# Patient Record
Sex: Male | Born: 1965 | Race: White | Hispanic: No | Marital: Married | Smoking: Never smoker
Health system: Southern US, Community
[De-identification: ages and names within clinical notes are randomized; demographics above are authoritative.]

## PROBLEM LIST (undated history)

## (undated) DIAGNOSIS — I10 Essential (primary) hypertension: Secondary | ICD-10-CM

## (undated) DIAGNOSIS — E785 Hyperlipidemia, unspecified: Secondary | ICD-10-CM

## (undated) DIAGNOSIS — F329 Major depressive disorder, single episode, unspecified: Secondary | ICD-10-CM

## (undated) DIAGNOSIS — F32A Depression, unspecified: Secondary | ICD-10-CM

## (undated) DIAGNOSIS — F419 Anxiety disorder, unspecified: Secondary | ICD-10-CM

## (undated) HISTORY — PX: HERNIA REPAIR: SHX51

## (undated) HISTORY — DX: Anxiety disorder, unspecified: F41.9

## (undated) HISTORY — DX: Hyperlipidemia, unspecified: E78.5

## (undated) HISTORY — DX: Depression, unspecified: F32.A

## (undated) HISTORY — DX: Major depressive disorder, single episode, unspecified: F32.9

## (undated) HISTORY — PX: ACHILLES TENDON REPAIR: SUR1153

## (undated) HISTORY — PX: APPENDECTOMY: SHX54

## (undated) HISTORY — DX: Essential (primary) hypertension: I10

---

## 2005-01-02 ENCOUNTER — Ambulatory Visit: Payer: Self-pay | Admitting: Family Medicine

## 2005-02-12 ENCOUNTER — Ambulatory Visit: Payer: Self-pay | Admitting: Family Medicine

## 2005-07-02 ENCOUNTER — Ambulatory Visit: Payer: Self-pay | Admitting: Family Medicine

## 2006-03-19 ENCOUNTER — Ambulatory Visit: Payer: Self-pay | Admitting: Family Medicine

## 2006-03-19 LAB — CONVERTED CEMR LAB
AST: 98 units/L — ABNORMAL HIGH (ref 0–37)
Albumin: 4.4 g/dL (ref 3.5–5.2)
BUN: 14 mg/dL (ref 6–23)
Calcium: 9.9 mg/dL (ref 8.4–10.5)
Chloride: 99 meq/L (ref 96–112)
Creatinine, Ser: 1.3 mg/dL (ref 0.4–1.5)
GFR calc non Af Amer: 65 mL/min
Glomerular Filtration Rate, Af Am: 78 mL/min/{1.73_m2}
HCT: 47.4 % (ref 39.0–52.0)
Hemoglobin: 15.9 g/dL (ref 13.0–17.0)
MCHC: 33.5 g/dL (ref 30.0–36.0)
MCV: 97.3 fL (ref 78.0–100.0)
Total Bilirubin: 1.4 mg/dL — ABNORMAL HIGH (ref 0.3–1.2)
WBC: 5.8 10*3/uL (ref 4.5–10.5)

## 2006-08-20 ENCOUNTER — Ambulatory Visit: Payer: Self-pay | Admitting: Family Medicine

## 2006-09-18 ENCOUNTER — Ambulatory Visit: Payer: Self-pay | Admitting: Family Medicine

## 2007-02-17 ENCOUNTER — Telehealth: Payer: Self-pay | Admitting: Family Medicine

## 2007-10-13 ENCOUNTER — Telehealth: Payer: Self-pay | Admitting: Family Medicine

## 2007-10-21 ENCOUNTER — Telehealth: Payer: Self-pay | Admitting: Family Medicine

## 2010-08-25 NOTE — Assessment & Plan Note (Signed)
[facility] OFFICE NOTE   NAME:[name], [name] BURAK                    MRN:          [id number]  DATE:03/19/2006                            DOB:          [birth year]    This is a 45 year old gentleman here for a complete physical  examination.  In general he has been doing fairly well, although he has  some  chronic low back pain.  He has been using over-the-counter agents  intermittently but would like to get on a more potent agent on a daily  basis.  He thinks his blood pressure has been fairly stable but he has  not checked it in a while.  His anxiety has been a bit increased over  the past few months.  He has doubled the Xanax from once a day to twice  a day and feels that this in combination with his Zoloft is now doing  well.  For other details of his past medical history, family history,  social history, habits, etc., I refer you to his introductory note here  on June 14, 2003.   ALLERGIES:  None.   CURRENT MEDICATIONS:  1. Lotrel 10/20 once a day.  2. Metoprolol XL 25 mg once a day.  3. Sertraline 100 mg once a day.  4. Xanax 1 mg b.i.d.  5. Flexeril 10 mg as needed.   OBJECTIVE:  VITAL SIGNS:  Height 6 feet 1 inch, weight 238, BP 128/72,  pulse 72 and regular.  GENERAL:  He remains overweight.  SKIN:  Clear.  HEENT:  Eyes are clear, ears are clear, pharynx clear.  NECK:  Supple without lymphadenopathy or masses.  LUNGS:  Clear.  CARDIAC:  Rate and rhythm regular without gallops,  murmurs, rubs.  Distal pulses are full.  ABDOMEN:  Soft, normal bowel sounds, nontender, no masses.  GENITALIA:  Normal male.  EXTREMITIES:  No clubbing, cyanosis, or edema.  NEUROLOGIC:  Grossly intact.  His affect is bright and he appears quite  comfortable.   ASSESSMENT AND PLAN:  1. Complete physical examination.  Recommended increased exercise and      will get some laboratory work today since he is fasting.  2.  Hypertension, stable, but to help with cost will switch from Lotrel      to Azor 10/20 to take once      daily.  I gave him samples today to last for the next 3 months.  3. Anxiety, stable.  I refilled his Xanax at his current level of 1 mg      b.i.d. #60 with five refills.  4. Degenerative joint pain.  Lodine 500 mg b.i.d.     Tera Mater. Clent Ridges, MD  Electronically Signed    SAF/MedQ  DD: 03/19/2006  DT: 03/20/2006  Job #: [id number]

## 2010-10-05 ENCOUNTER — Ambulatory Visit (INDEPENDENT_AMBULATORY_CARE_PROVIDER_SITE_OTHER): Payer: BC Managed Care – PPO | Admitting: Family Medicine

## 2010-10-05 ENCOUNTER — Encounter: Payer: Self-pay | Admitting: Family Medicine

## 2010-10-05 VITALS — BP 120/74 | Temp 98.6°F | Ht 73.5 in | Wt 238.0 lb

## 2010-10-05 DIAGNOSIS — K219 Gastro-esophageal reflux disease without esophagitis: Secondary | ICD-10-CM

## 2010-10-05 DIAGNOSIS — F411 Generalized anxiety disorder: Secondary | ICD-10-CM

## 2010-10-05 DIAGNOSIS — I10 Essential (primary) hypertension: Secondary | ICD-10-CM

## 2010-10-05 DIAGNOSIS — F419 Anxiety disorder, unspecified: Secondary | ICD-10-CM

## 2010-10-05 MED ORDER — METOPROLOL TARTRATE 25 MG PO TABS: 25.0000 mg | ORAL_TABLET | Freq: Two times a day (BID) | ORAL | Status: DC

## 2010-10-05 MED ORDER — LANSOPRAZOLE 30 MG PO CPDR: 30.0000 mg | DELAYED_RELEASE_CAPSULE | Freq: Every day | ORAL | Status: DC

## 2010-10-05 MED ORDER — AMLODIPINE-OLMESARTAN 10-20 MG PO TABS: 1.0000 | ORAL_TABLET | Freq: Every day | ORAL | Status: DC

## 2010-10-05 MED ORDER — ALPRAZOLAM 1 MG PO TABS: 1.0000 mg | ORAL_TABLET | Freq: Two times a day (BID) | ORAL | Status: DC

## 2010-10-05 NOTE — Progress Notes (Signed)
  Subjective:    Patient ID: [name] [name], male    DOB: 02-Feb-1966, 45 y.o.   MRN: [id number]  HPI 45 yr old male to re-establish with [place] after living in [place] for the past 4 years. He has moved back to [place] to help his mother, who has some health issues. He has not found a job yet. He feels well but asks for med refills. His BP has been stable. He eats well and works out regularly. His anxiety has been stable.    Review of Systems  Constitutional: Negative.   Respiratory: Negative.   Cardiovascular: Negative.   Psychiatric/Behavioral: Negative.        Objective:   Physical Exam  Constitutional: He appears well-developed and well-nourished.  Neck: No thyromegaly present.  Cardiovascular: Normal rate, regular rhythm, normal heart sounds and intact distal pulses.   Pulmonary/Chest: Effort normal and breath sounds normal.  Lymphadenopathy:    He has no cervical adenopathy.  Psychiatric: He has a normal mood and affect. His behavior is normal. Thought content normal.          Assessment & Plan:  Refilled meds as above. He will plan on getting a cpx soon

## 2011-04-17 ENCOUNTER — Encounter: Payer: Self-pay | Admitting: Family Medicine

## 2011-04-17 ENCOUNTER — Ambulatory Visit (INDEPENDENT_AMBULATORY_CARE_PROVIDER_SITE_OTHER): Payer: BC Managed Care – PPO | Admitting: Family Medicine

## 2011-04-17 VITALS — BP 116/70 | HR 82 | Temp 98.7°F | Wt 240.0 lb

## 2011-04-17 DIAGNOSIS — F411 Generalized anxiety disorder: Secondary | ICD-10-CM

## 2011-04-17 DIAGNOSIS — F419 Anxiety disorder, unspecified: Secondary | ICD-10-CM

## 2011-04-17 DIAGNOSIS — I10 Essential (primary) hypertension: Secondary | ICD-10-CM

## 2011-04-17 MED ORDER — ALPRAZOLAM 1 MG PO TABS: 1.0000 mg | ORAL_TABLET | Freq: Two times a day (BID) | ORAL | Status: DC

## 2011-04-17 NOTE — Progress Notes (Signed)
  Subjective:    Patient ID: [name] [name], male    DOB: Jun 24, 1965, 46 y.o.   MRN: [id number]  HPI Here to follow up. He feels good, and his BP is stable. He is not lifting weights now but he does try to walk every day. Still looking for work.    Review of Systems  Constitutional: Negative.   Respiratory: Negative.   Cardiovascular: Negative.   Psychiatric/Behavioral: Negative.        Objective:   Physical Exam  Constitutional: He appears well-developed and well-nourished.  Cardiovascular: Normal rate, regular rhythm, normal heart sounds and intact distal pulses.   Pulmonary/Chest: Effort normal and breath sounds normal.  Psychiatric: He has a normal mood and affect. His behavior is normal. Thought content normal.          Assessment & Plan:  Doing well. Refilled meds

## 2011-06-05 ENCOUNTER — Encounter: Payer: Self-pay | Admitting: Family Medicine

## 2011-06-05 ENCOUNTER — Ambulatory Visit (INDEPENDENT_AMBULATORY_CARE_PROVIDER_SITE_OTHER): Payer: Self-pay | Admitting: Family Medicine

## 2011-06-05 VITALS — BP 108/70 | HR 82 | Temp 98.9°F | Wt 238.0 lb

## 2011-06-05 DIAGNOSIS — J40 Bronchitis, not specified as acute or chronic: Secondary | ICD-10-CM

## 2011-06-05 MED ORDER — AZITHROMYCIN 250 MG PO TABS: ORAL_TABLET | ORAL | Status: AC

## 2011-06-05 NOTE — Progress Notes (Signed)
  Subjective:    Patient ID: Kamarri Lovvorn Beauchaine, male    DOB: 11-Sep-1965, 46 y.o.   MRN: [id number]  HPI Here for one week of chest tightness and coughing up green sputum. No fever.    Review of Systems  Constitutional: Negative.   HENT: Negative.   Eyes: Negative.   Respiratory: Positive for cough and chest tightness.   Cardiovascular: Negative.        Objective:   Physical Exam  Constitutional: He appears well-developed and well-nourished.  HENT:  Right Ear: External ear normal.  Left Ear: External ear normal.  Nose: Nose normal.  Mouth/Throat: Oropharynx is clear and moist. No oropharyngeal exudate.  Eyes: Conjunctivae are normal.  Pulmonary/Chest: Effort normal and breath sounds normal. No respiratory distress. He has no wheezes. He has no rales.  Lymphadenopathy:    He has no cervical adenopathy.          Assessment & Plan:  Add Mucinex

## 2011-06-27 ENCOUNTER — Emergency Department (HOSPITAL_COMMUNITY)
Admission: EM | Admit: 2011-06-27 | Discharge: 2011-06-27 | Disposition: A | Discharge status: Home Health | Payer: BC Managed Care – PPO | Attending: Emergency Medicine | Admitting: Emergency Medicine

## 2011-06-27 ENCOUNTER — Ambulatory Visit (INDEPENDENT_AMBULATORY_CARE_PROVIDER_SITE_OTHER): Payer: BC Managed Care – PPO | Admitting: Family Medicine

## 2011-06-27 ENCOUNTER — Emergency Department (HOSPITAL_COMMUNITY): Payer: BC Managed Care – PPO

## 2011-06-27 ENCOUNTER — Encounter (HOSPITAL_COMMUNITY): Payer: Self-pay | Admitting: Adult Health

## 2011-06-27 ENCOUNTER — Encounter: Payer: Self-pay | Admitting: Family Medicine

## 2011-06-27 ENCOUNTER — Ambulatory Visit: Payer: Self-pay | Admitting: Family Medicine

## 2011-06-27 VITALS — BP 118/74 | HR 97 | Temp 101.8°F | Wt 239.0 lb

## 2011-06-27 DIAGNOSIS — IMO0001 Reserved for inherently not codable concepts without codable children: Secondary | ICD-10-CM | POA: Insufficient documentation

## 2011-06-27 DIAGNOSIS — R059 Cough, unspecified: Secondary | ICD-10-CM | POA: Insufficient documentation

## 2011-06-27 DIAGNOSIS — R079 Chest pain, unspecified: Secondary | ICD-10-CM

## 2011-06-27 DIAGNOSIS — R209 Unspecified disturbances of skin sensation: Secondary | ICD-10-CM | POA: Insufficient documentation

## 2011-06-27 DIAGNOSIS — J189 Pneumonia, unspecified organism: Secondary | ICD-10-CM

## 2011-06-27 DIAGNOSIS — I10 Essential (primary) hypertension: Secondary | ICD-10-CM | POA: Insufficient documentation

## 2011-06-27 DIAGNOSIS — J069 Acute upper respiratory infection, unspecified: Secondary | ICD-10-CM | POA: Insufficient documentation

## 2011-06-27 DIAGNOSIS — R05 Cough: Secondary | ICD-10-CM | POA: Insufficient documentation

## 2011-06-27 DIAGNOSIS — R509 Fever, unspecified: Secondary | ICD-10-CM

## 2011-06-27 DIAGNOSIS — E785 Hyperlipidemia, unspecified: Secondary | ICD-10-CM | POA: Insufficient documentation

## 2011-06-27 DIAGNOSIS — R0602 Shortness of breath: Secondary | ICD-10-CM | POA: Insufficient documentation

## 2011-06-27 DIAGNOSIS — Z79899 Other long term (current) drug therapy: Secondary | ICD-10-CM | POA: Insufficient documentation

## 2011-06-27 DIAGNOSIS — F341 Dysthymic disorder: Secondary | ICD-10-CM | POA: Insufficient documentation

## 2011-06-27 LAB — BASIC METABOLIC PANEL
BUN: 11 mg/dL (ref 6–23)
CO2: 28 mEq/L (ref 19–32)
Calcium: 9.6 mg/dL (ref 8.4–10.5)
Chloride: 95 mEq/L — ABNORMAL LOW (ref 96–112)
Creatinine, Ser: 1.28 mg/dL (ref 0.50–1.35)
GFR calc Af Amer: 76 mL/min — ABNORMAL LOW (ref >90)
GFR calc non Af Amer: 66 mL/min — ABNORMAL LOW (ref >90)
Glucose, Bld: 99 mg/dL (ref 70–99)
Potassium: 4 mEq/L (ref 3.5–5.1)
Sodium: 135 mEq/L (ref 135–145)

## 2011-06-27 LAB — POCT I-STAT TROPONIN I: Troponin i, poc: 0.03 ng/mL (ref 0.00–0.08)

## 2011-06-27 LAB — CBC
HCT: 40.8 % (ref 39.0–52.0)
Hemoglobin: 14.5 g/dL (ref 13.0–17.0)
MCH: 31.9 pg (ref 26.0–34.0)
MCHC: 35.5 g/dL (ref 30.0–36.0)
MCV: 89.9 fL (ref 78.0–100.0)
Platelets: 144 10*3/uL — ABNORMAL LOW (ref 150–400)
RBC: 4.54 MIL/uL (ref 4.22–5.81)
RDW: 12.2 % (ref 11.5–15.5)
WBC: 4 10*3/uL (ref 4.0–10.5)

## 2011-06-27 MED ORDER — GUAIFENESIN-CODEINE 100-10 MG/5ML PO SOLN
5.0000 mL | Freq: Once | ORAL | Status: AC
  Administered 2011-06-27: 5 mL via ORAL
  Filled 2011-06-27: qty 5

## 2011-06-27 MED ORDER — SODIUM CHLORIDE 0.9 % IV BOLUS (SEPSIS)
1000.0000 mL | Freq: Once | INTRAVENOUS | Status: AC
  Administered 2011-06-27: 1000 mL via INTRAVENOUS

## 2011-06-27 MED ORDER — GUAIFENESIN-CODEINE 100-10 MG/5ML PO SYRP: 5.0000 mL | ORAL_SOLUTION | Freq: Three times a day (TID) | ORAL | Status: AC | PRN

## 2011-06-27 MED ORDER — KETOROLAC TROMETHAMINE 30 MG/ML IJ SOLN
INTRAMUSCULAR | Status: AC
  Administered 2011-06-27: 15 mg
  Filled 2011-06-27: qty 1

## 2011-06-27 MED ORDER — KETOROLAC TROMETHAMINE 15 MG/ML IJ SOLN
15.0000 mg | Freq: Once | INTRAMUSCULAR | Status: DC
  Filled 2011-06-27: qty 1

## 2011-06-27 MED ORDER — IOHEXOL 300 MG/ML  SOLN
150.0000 mL | Freq: Once | INTRAMUSCULAR | Status: AC | PRN
  Administered 2011-06-27: 150 mL via INTRAVENOUS

## 2011-06-27 NOTE — ED Provider Notes (Signed)
History    46yM with fever and cough. Onset yesterday. Progressively worsening. Cough nonproductive. Dyspnea. No unusual leg pain or swelling. No sick contacts. No nausea or vomiting. Denies history of blood clot. Denies chest pain. No change in mental status. CSN: [id number]  Arrival date & time 06/27/11  1626   First MD Initiated Contact with Patient 06/27/11 1834      Chief Complaint  Patient presents with  . Fever  . Shortness of Breath  . Numbness    (Consider location/radiation/quality/duration/timing/severity/associated sxs/prior treatment) HPI  Past Medical History  Diagnosis Date  . Hypertension   . Hyperlipidemia   . Depression   . Anxiety     Past Surgical History  Procedure Date  . Appendectomy   . Hernia repair   . Achilles tendon repair     Family History  Problem Relation Age of Onset  . Adopted: Yes    History  Substance Use Topics  . Smoking status: Never Smoker   . Smokeless tobacco: Never Used  . Alcohol Use: 0.5 oz/week    1 drink(s) per week      Review of Systems   Review of symptoms negative unless otherwise noted in HPI.  Allergies  Review of patient's allergies indicates no known allergies.  Home Medications   Current Outpatient Rx  Name Route Sig Dispense Refill  . ALPRAZOLAM 1 MG PO TABS Oral Take 1 mg by mouth. Take once a day, sometimes twice    . AMLODIPINE-OLMESARTAN 10-20 MG PO TABS Oral Take 1 tablet by mouth daily. 30 tablet 11  . LANSOPRAZOLE 30 MG PO CPDR Oral Take 1 capsule (30 mg total) by mouth daily. 30 capsule 11  . METOPROLOL TARTRATE 25 MG PO TABS Oral Take 1 tablet (25 mg total) by mouth 2 (two) times daily. 60 tablet 11    BP 142/79  Pulse 100  Temp(Src) 101.4 F (38.6 C) (Oral)  Resp 20  SpO2 98%  Physical Exam  Nursing note and vitals reviewed. Constitutional: He appears well-developed and well-nourished. No distress.       Sitting up in bed. Mildly uncomfortable but not toxic.  HENT:  Head:  Normocephalic and atraumatic.  Eyes: Conjunctivae are normal. Pupils are equal, round, and reactive to light. Right eye exhibits no discharge. Left eye exhibits no discharge.  Neck: Neck supple.  Cardiovascular: Regular rhythm and normal heart sounds.  Exam reveals no gallop and no friction rub.   No murmur heard.      Mildly tachycardic with a regular rhythm  Pulmonary/Chest: Effort normal and breath sounds normal. No respiratory distress. He exhibits no tenderness.  Abdominal: Soft. He exhibits no distension. There is no tenderness.  Musculoskeletal: He exhibits no edema and no tenderness.       Lower extremities symmetric as compared to each other. There is no calf tenderness. No edema. Negative Homans sign. No palpable cords.  Neurological: He is alert.  Skin: Skin is warm and dry.  Psychiatric: He has a normal mood and affect. His behavior is normal. Thought content normal.    ED Course  Procedures (including critical care time)  Labs Reviewed  CBC - Abnormal; Notable for the following:    Platelets 144 (*)    All other components within normal limits  BASIC METABOLIC PANEL - Abnormal; Notable for the following:    Chloride 95 (*)    GFR calc non Af Amer 66 (*)    GFR calc Af Amer 76 (*)  All other components within normal limits  POCT I-STAT TROPONIN I   Dg Chest 2 View  06/27/2011  *RADIOLOGY REPORT*  Clinical Data: Short of breath and fever.  CHEST - 2 VIEW  Comparison: None.  Findings: Heart size is normal.  Mediastinal shadows are normal. Lungs are clear.  No effusions.  No bony abnormalities.  IMPRESSION: Normal chest  Original Report Authenticated By: Thomasenia Sales, M.D.   Ct Angio Chest W/cm &/or Wo Cm  06/27/2011  *RADIOLOGY REPORT*  Clinical Data: Fever and shortness of breath.  CT ANGIOGRAPHY CHEST  Technique:  Multidetector CT imaging of the chest using the standard protocol during bolus administration of intravenous contrast. Multiplanar reconstructed images  including MIPs were obtained and reviewed to evaluate the vascular anatomy.  Contrast: OMNIPAQUE IOHEXOL 300 MG/ML IJ SOLN  Comparison: None  Findings:  No enlarged axillary or supraclavicular lymph nodes.  Borderline right paratracheal lymph node measures 1.1 cm.  The subcarinal lymph node measures 1 cm, image 117.  No pericardial or pleural effusion identified.  Trachea appears patent and is midline.  The lungs are clear.  There is no airspace consolidation.  No suspicious pulmonary nodule or mass.  No abnormal filling defects identified within the main pulmonary artery or its branches to suggest an acute pulmonary embolus.  Review of the visualized bony structures is unremarkable.  IMPRESSION:  1.  No active cardiopulmonary abnormalities. 2.  No evidence for pulmonary embolus.  Original Report Authenticated By: Rosealee Albee, M.D.   EKG:  Rhythm: Normal sinus rhythm Rate: 96 Axis: Normal axis Intervals: Normal ST segments: Nonspecific ST changes. There is T-wave flattening laterally and inferiorly  1. Fever   2. Upper respiratory infection       MDM  46 year old male with a febrile illness. Suspect viral etiology. Consider bacterial cause but clinically less concern for this. Patient is febrile but is generally well appearing. He is not showing any signs of respiratory distress. His oxygen saturations are normal on room air. His lung examination is nonfocal. Chest x-ray and subsequent CT angiography of his chest did not show any evidence of focal infiltrate, pulmonary embolism or other pathology which could possibly explain his symptoms. He has no leukocytosis. I feel the patient is safe for discharge at this time. Plan symptomatic treatment. Return precautions were discussed. Outpatient followup.         Raeford Razor, MD 07/13/11 [phone]

## 2011-06-27 NOTE — ED Notes (Signed)
Went to Dr. This am with with a dry panful cough, SOB and numbness, fever. Sent here for further evaluation. Bilateral lung sounds diminished. Pt unable to take deep breath.

## 2011-06-27 NOTE — Progress Notes (Signed)
  Subjective:    Patient ID: [name] [name], male    DOB: 08/15/1965, 46 y.o.   MRN: [id number]  HPI Here for the sudden onset yesterday of body aches, fevers, sweats, a severe HA, chest pains, and a deep non-productive cough. He is nauseated but has not vomited. He was seen here on 06-05-11 for a mild bronchitis and was given a Zpack. He says this worked well and he felt fine until yesterday. Today he is very weak and his sister and mother drove him here.    Review of Systems  Constitutional: Positive for fever and fatigue.  HENT: Negative.   Eyes: Negative.   Respiratory: Positive for cough, chest tightness, shortness of breath and wheezing.   Cardiovascular: Positive for chest pain. Negative for palpitations and leg swelling.       Objective:   Physical Exam  Constitutional:       Weak, shaky, looks quite ill   HENT:  Right Ear: External ear normal.  Left Ear: External ear normal.  Nose: Nose normal.  Mouth/Throat: Oropharynx is clear and moist. No oropharyngeal exudate.  Eyes: Conjunctivae are normal. Pupils are equal, round, and reactive to light.  Neck: Normal range of motion. Neck supple. No thyromegaly present.  Cardiovascular: Normal rate, regular rhythm, normal heart sounds and intact distal pulses.  Exam reveals no gallop and no friction rub.   No murmur heard.      EKG normal   Pulmonary/Chest: Effort normal and breath sounds normal. No respiratory distress. He has no wheezes. He has no rales. He exhibits no tenderness.  Abdominal: Soft. Bowel sounds are normal. He exhibits no distension and no mass. There is no tenderness. There is no rebound and no guarding.  Lymphadenopathy:    He has no cervical adenopathy.          Assessment & Plan:  Clinically this looks like pneumonia. He needs further evaluation with labs and a CXR. He needs IV fluids. His family will drive him directly from here to [facility] ER.

## 2011-06-27 NOTE — Discharge Instructions (Signed)
Antibiotic Nonuse  Your caregiver felt that the infection or problem was not one that would be helped with an antibiotic. Infections may be caused by viruses or bacteria. Only a caregiver can tell which one of these is the likely cause of an illness. A cold is the most common cause of infection in both adults and children. A cold is a virus. Antibiotic treatment will have no effect on a viral infection. Viruses can lead to many lost days of work caring for sick children and many missed days of school. Children may catch as many as 10 "colds" or "flus" per year during which they can be tearful, cranky, and uncomfortable. The goal of treating a virus is aimed at keeping the ill person comfortable. Antibiotics are medications used to help the body fight bacterial infections. There are relatively few types of bacteria that cause infections but there are hundreds of viruses. While both viruses and bacteria cause infection they are very different types of germs. A viral infection will typically go away by itself within 7 to 10 days. Bacterial infections may spread or get worse without antibiotic treatment. Examples of bacterial infections are:  Sore throats (like strep throat or tonsillitis).   Infection in the lung (pneumonia).   Ear and skin infections.  Examples of viral infections are:  Colds or flus.   Most coughs and bronchitis.   Sore throats not caused by Strep.   Runny noses.  It is often best not to take an antibiotic when a viral infection is the cause of the problem. Antibiotics can kill off the helpful bacteria that we have inside our body and allow harmful bacteria to start growing. Antibiotics can cause side effects such as allergies, nausea, and diarrhea without helping to improve the symptoms of the viral infection. Additionally, repeated uses of antibiotics can cause bacteria inside of our body to become resistant. That resistance can be passed onto harmful bacterial. The next time  you have an infection it may be harder to treat if antibiotics are used when they are not needed. Not treating with antibiotics allows our own immune system to develop and take care of infections more efficiently. Also, antibiotics will work better for [place] when they are prescribed for bacterial infections. Treatments for a child that is ill may include:  Give extra fluids throughout the day to stay hydrated.   Get plenty of rest.   Only give your child over-the-counter or prescription medicines for pain, discomfort, or fever as directed by your caregiver.   The use of a cool mist humidifier may help stuffy noses.   Cold medications if suggested by your caregiver.  Your caregiver may decide to start you on an antibiotic if:  The problem you were seen for today continues for a longer length of time than expected.   You develop a secondary bacterial infection.  SEEK MEDICAL CARE IF:  Fever lasts longer than 5 days.   Symptoms continue to get worse after 5 to 7 days or become severe.   Difficulty in breathing develops.   Signs of dehydration develop (poor drinking, rare urinating, dark colored urine).   Changes in behavior or worsening tiredness (listlessness or lethargy).  Document Released: 06/04/2001 Document Revised: 03/15/2011 Document Reviewed: 12/01/2008 [facility] Patient Information 2012 [place], [place].Fever, Adult A fever is a temperature of 100.4 F (38 C) or above.  HOME CARE  Take fever medicine as told by your doctor. Do not  take aspirin for fever if you are younger than  47 years of age.   If you are given antibiotic medicine, take it as told. Finish the medicine even if you start to feel better.   Rest.   Drink enough fluids to keep your pee (urine) clear or pale yellow. Do not drink alcohol.   Take a bath or shower with room temperature water. Do not use ice water or alcohol sponge baths.   Wear lightweight, loose clothes.  GET HELP RIGHT AWAY IF:   You are  short of breath or have trouble breathing.   You are very weak.   You are dizzy or you pass out (faint).   You are very thirsty or are making little or no urine.   You have new pain.   You throw up (vomit) or have watery poop (diarrhea).   You keep throwing up or having watery poop for more than 1 to 2 days.   You have a stiff neck or light bothers your eyes.   You have a skin rash.   You have a fever or problems (symptoms) that last for more than 2 to 3 days.   You have a fever and your problems quickly get worse.   You keep throwing up the fluids you drink.   You do not feel better after 3 days.   You have new problems.  MAKE SURE YOU:   Understand these instructions.   Will watch your condition.   Will get help right away if you are not doing well or get worse.  Document Released: 01/03/2008 Document Revised: 03/15/2011 Document Reviewed: 01/25/2011 [facility] Patient Information 2012 [place], [place].  RESOURCE GUIDE  Dental Problems  Patients with Medicaid: [facility] [phone] [address]                                           [phone] [address] Phone:  [phone]                                                  Phone:  [phone]  If unable to pay or uninsured, contact:  [facility] or [facility]. to become qualified for the adult dental clinic.  Chronic Pain Problems Contact [facility]  [phone] Patients need to be referred by their primary care doctor.  Insufficient Money for Medicine Contact [employer]:  call "211" or [facility] 680 217 1029.  No Primary Care Doctor Call [facility]  213-830-1475 Other agencies that provide inexpensive medical care    [facility]  [phone]    [facility]  [phone]    [facility]  [phone]    [facility]  [phone]    [facility]  [phone]    [facility]   [phone]  Psychological Services [facility]  [phone] [facility] Services  [phone] [facility]   [phone] (emergency services 279-679-8305)  Substance Abuse Resources [facility]  813-178-3701 [facility] [phone]) [phone] [place] 201-589-8440 Floydene Flock ([phone] Residential & Outpatient Substance Abuse Program  260-695-8370  Abuse/Neglect [facility]  Child Abuse Hotline (573)426-5175 [facility] Child Abuse Hotline 5732656398 (After Hours)  Emergency Shelter [facility] [phone]  M[facility] [place] [facility] (214)062-1792 [facility] 864-164-7710  MRSA Hotline #:   432-199-6511    [facility] Resources  [facility]     [employer]                          [facility] Dept. [address] [place]                       [address]      [address]  Blondell Reveal Phone:  [phone]                                   Phone:  [phone]                 Phone:  [phone]  [facility] Mental [facility] Phone:  [phone]  [facility] Child Abuse Hotline [phone] (After Hours)

## 2011-06-29 ENCOUNTER — Telehealth: Payer: Self-pay | Admitting: Family Medicine

## 2011-06-29 NOTE — Telephone Encounter (Signed)
Dr. Clent Ridges did approve this and the note is ready for pick up. I spoke with pt and his mom will be picking up the note.

## 2011-06-29 NOTE — Telephone Encounter (Signed)
Pt called back and was informed he would need to come in to see the doctor. Pt stated that his job is temporary and that he just needed a note for the week of March 17th- 23rd stating that he was in the hospital and was unable to work. Pt is worried if he does not receive the note they will say it was job abandonment. Pt did not want to schedule appt at this time.please advise

## 2011-06-29 NOTE — Telephone Encounter (Signed)
Patient called stating he need a note out of work or he will be terminated patient was sent to hospital on 06/27/11 and can not return to work until the 07/07/11 per the hospital. Please assist.

## 2011-06-29 NOTE — Telephone Encounter (Signed)
I would need to see him again to determine how long he needs to be out of work.

## 2011-07-11 ENCOUNTER — Telehealth: Payer: Self-pay | Admitting: Family Medicine

## 2011-07-11 NOTE — Telephone Encounter (Signed)
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 I advised patient to come by and request his lov which lists his medications. FYI

## 2011-07-11 NOTE — Telephone Encounter (Signed)
I spoke with pt and this was already done.

## 2011-10-19 ENCOUNTER — Other Ambulatory Visit: Payer: Self-pay | Admitting: Family Medicine

## 2011-10-19 ENCOUNTER — Telehealth: Payer: Self-pay | Admitting: Family Medicine

## 2011-10-19 MED ORDER — ALPRAZOLAM 1 MG PO TABS: 1.0000 mg | ORAL_TABLET | Freq: Two times a day (BID) | ORAL | Status: DC | PRN

## 2011-10-19 NOTE — Telephone Encounter (Signed)
Call in #60 with 5 rf 

## 2011-10-19 NOTE — Telephone Encounter (Signed)
I called in script 

## 2011-10-19 NOTE — Telephone Encounter (Signed)
Refill request for Alprazolam 1 mg take 1 po bid and pt last here on 06/27/11.

## 2011-10-22 ENCOUNTER — Other Ambulatory Visit: Payer: Self-pay | Admitting: Family Medicine

## 2012-04-19 ENCOUNTER — Other Ambulatory Visit: Payer: Self-pay | Admitting: Family Medicine

## 2012-04-21 NOTE — Telephone Encounter (Signed)
Call in #60 with 5 rf 

## 2012-04-23 ENCOUNTER — Telehealth: Payer: Self-pay | Admitting: Family Medicine

## 2012-04-23 NOTE — Telephone Encounter (Signed)
Prior auth in process. Pt aware

## 2012-04-23 NOTE — Telephone Encounter (Signed)
Pt's insurance company is requiring prior authorization, according to pt's pharm for his  AZOR 10-20 MG per tablet.   Pt will take his last tablet tonight. Pls advise.

## 2012-04-24 ENCOUNTER — Telehealth: Payer: Self-pay | Admitting: Family Medicine

## 2012-04-24 NOTE — Telephone Encounter (Signed)
PLEASE CALL Danielle at [phone].[name] [name] states he takes Animator for hypertension. States he notified office on 04/23/12 that he was taking his last dose on 04/23/12. Pharmacy was waiting for authorization. [name] [name] states he found out on 04/24/12 that his insurance will no longer cover his Azor- states he signed up for the Affordable Care act.  [place] requesting a call back from Dr. Clent Ridges. Requesting a new prescription be called in today 04/24/12 for high blood pressure. 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 Wants a medication that does not cause erectile dysfunction. [facility]ses [facility] [address] [phone]

## 2012-04-25 MED ORDER — AMLODIPINE BESYLATE 10 MG PO TABS: 10.0000 mg | ORAL_TABLET | Freq: Every day | ORAL | Status: DC

## 2012-04-25 MED ORDER — OLMESARTAN MEDOXOMIL 20 MG PO TABS: 20.0000 mg | ORAL_TABLET | Freq: Every day | ORAL | Status: DC

## 2012-04-25 NOTE — Telephone Encounter (Signed)
I spoke with pt and sent both scripts e-scribe. 

## 2012-04-25 NOTE — Telephone Encounter (Signed)
Please see note below. There is a prior auth (as requested) on the AZOR in process. Hoping to hear today if it is approved. Sounds like pt wants a diff rx instead. Please advise. Is now out of BP meds.

## 2012-04-25 NOTE — Telephone Encounter (Signed)
Lets split this up into Olmesartan 20 mg a day and Amlodipine 10 mg a day. Call in #30 of both with 11 rf

## 2012-04-25 NOTE — Telephone Encounter (Signed)
Dr. Clent Ridges,  Patient is agreeable to calling in both components of the AZOR separately, as generics, for 30 days. He would like to pick up today, as he is out of meds. Currently on amlodipine-olmesartan (AZOR) 10-20 MG per tablet  [facility] [address]

## 2012-04-28 ENCOUNTER — Telehealth: Payer: Self-pay | Admitting: Family Medicine

## 2012-04-28 NOTE — Telephone Encounter (Signed)
Actually just cancel the order for Olmesartan and substitute Cozaar 50 mg a day to take along with the Amlodipine.

## 2012-04-28 NOTE — Telephone Encounter (Signed)
Dr. Clent Ridges,  Patient's AZOR was denied. Additionally, when we tried to send in the olmesartan and amlodipine separately, [employer] wants prior auth on the Benicar.  However, in the denial for the AZOR, these were listed as preferred: Atacand, Atacand HCT, Avalide, Avapro, Cozaar, Diovan HCT (NOT regular Diovan), Hyzaar, teveten, teveten HCT. Will one of these work, or should I proceed w/prior auth on olmesartan?

## 2012-04-29 ENCOUNTER — Ambulatory Visit (INDEPENDENT_AMBULATORY_CARE_PROVIDER_SITE_OTHER): Payer: BC Managed Care – PPO | Admitting: Family Medicine

## 2012-04-29 ENCOUNTER — Encounter: Payer: Self-pay | Admitting: Family Medicine

## 2012-04-29 VITALS — BP 112/74 | HR 76 | Temp 98.6°F | Wt 233.0 lb

## 2012-04-29 DIAGNOSIS — I10 Essential (primary) hypertension: Secondary | ICD-10-CM

## 2012-04-29 MED ORDER — LOSARTAN POTASSIUM 50 MG PO TABS: 50.0000 mg | ORAL_TABLET | Freq: Every day | ORAL | Status: DC

## 2012-04-29 NOTE — Telephone Encounter (Signed)
Nettie Elm, please see note below and send in rx. Thanks!

## 2012-04-29 NOTE — Progress Notes (Signed)
  Subjective:    Patient ID: [name] [name], male    DOB: February 22, 1966, 47 y.o.   MRN: [id number]  HPI Here to discuss his HTN. He tells me that he stopped taking Human Growth Hormone and testosterone a year ago and that he has lost 45 lbs. His BP at home is usually 110-120 systolic and he feels great. His insurance company recently said they would no longer cover Azor.    Review of Systems  Constitutional: Negative.   Respiratory: Negative.   Cardiovascular: Negative.        Objective:   Physical Exam  Constitutional: He appears well-developed and well-nourished.  Cardiovascular: Normal rate, regular rhythm, normal heart sounds and intact distal pulses.   Pulmonary/Chest: Effort normal and breath sounds normal.          Assessment & Plan:  Since his HTN is so well controlled we can cut back on his meds a little. We will put him on Metoprolol and Amlodipine and stop the ARB. Recheck one month

## 2012-04-29 NOTE — Telephone Encounter (Signed)
I sent Cozaar script e-scribe

## 2012-05-26 ENCOUNTER — Encounter: Payer: Self-pay | Admitting: Family

## 2012-05-26 ENCOUNTER — Ambulatory Visit (INDEPENDENT_AMBULATORY_CARE_PROVIDER_SITE_OTHER): Payer: BC Managed Care – PPO | Admitting: Family

## 2012-05-26 VITALS — BP 144/90 | HR 67 | Temp 98.4°F | Wt 230.0 lb

## 2012-05-26 DIAGNOSIS — I10 Essential (primary) hypertension: Secondary | ICD-10-CM

## 2012-05-26 DIAGNOSIS — J029 Acute pharyngitis, unspecified: Secondary | ICD-10-CM

## 2012-05-26 MED ORDER — METHYLPREDNISOLONE ACETATE 40 MG/ML IJ SUSP
80.0000 mg | Freq: Once | INTRAMUSCULAR | Status: AC
  Administered 2012-05-26: 80 mg via INTRAMUSCULAR

## 2012-05-26 MED ORDER — AMOXICILLIN 500 MG PO CAPS: 1000.0000 mg | ORAL_CAPSULE | Freq: Two times a day (BID) | ORAL | Status: DC

## 2012-05-26 NOTE — Patient Instructions (Addendum)

## 2012-05-26 NOTE — Progress Notes (Signed)
  Subjective:    Patient ID: [name] [name], male    DOB: [birth year], 47 y.o.   MRN: [id number]  HPI 47 year old, WM, nonsmoker, patient of Dr. Clent Ridges is in today with c/o worsening, sore throat x 3 days. Unable to eat. Denies fever, headache, or congestion.    Review of Systems  Constitutional: Negative.   HENT: Positive for sore throat. Negative for ear pain, congestion and postnasal drip.   Respiratory: Negative.   Cardiovascular: Negative.   Musculoskeletal: Negative.   Skin: Negative.   Allergic/Immunologic: Negative.   Neurological: Negative.   Hematological: Negative.   Psychiatric/Behavioral: Negative.    Past Medical History  Diagnosis Date  . Hypertension   . Hyperlipidemia   . Depression   . Anxiety     History   Social History  . Marital Status: Married    Spouse Name: N/A    Number of Children: N/A  . Years of Education: N/A   Occupational History  . Not on file.   Social History Main Topics  . Smoking status: Never Smoker   . Smokeless tobacco: Never Used  . Alcohol Use: 0.5 oz/week    1 drink(s) per week  . Drug Use: No  . Sexually Active: Not on file   Other Topics Concern  . Not on file   Social History Narrative  . No narrative on file    Past Surgical History  Procedure Laterality Date  . Appendectomy    . Hernia repair    . Achilles tendon repair      Family History  Problem Relation Age of Onset  . Adopted: Yes    No Known Allergies  Current Outpatient Prescriptions on File Prior to Visit  Medication Sig Dispense Refill  . ALPRAZolam (XANAX) 1 MG tablet TAKE 1 TABLET BY MOUTH TWICE A DAY AS NEEDED  60 tablet  5  . amLODipine (NORVASC) 10 MG tablet Take 1 tablet (10 mg total) by mouth daily.  30 tablet  11  . lansoprazole (PREVACID) 30 MG capsule TAKE ONE CAPSULE BY MOUTH EVERY DAY  30 capsule  11  . metoprolol tartrate (LOPRESSOR) 25 MG tablet TAKE 1 TABLET BY MOUTH TWICE A DAY  60 tablet  11   No current  facility-administered medications on file prior to visit.    BP 144/90  Pulse 67  Temp(Src) 98.4 F (36.9 C) (Oral)  Wt 230 lb (104.327 kg)  BMI 29.93 kg/m2  SpO2 97%chart    Objective:   Physical Exam  Constitutional: He is oriented to person, place, and time. He appears well-developed and well-nourished.  HENT:  Right Ear: External ear normal.  Left Ear: External ear normal.  Nose: Nose normal.  Throat is red, swollen, and tender.   Neck: Normal range of motion. Neck supple.  Cardiovascular: Normal rate, regular rhythm and normal heart sounds.   Pulmonary/Chest: Effort normal and breath sounds normal.  Neurological: He is oriented to person, place, and time.  Skin: Skin is warm and dry.  Psychiatric: He has a normal mood and affect.   Rapid Strep: Positive   Depo-Medrol 80mg  IM x 1      Assessment & Plan:  Assessment:  1. Pharyngitis-Strep 2. Hypertension  Plan: Amoxil 1000mg  twice a day. Continue current meds. Drink plenty of fluids. Call the office if symptoms worsen or persist. Recheck as scheduled and as needed.

## 2012-09-17 ENCOUNTER — Telehealth: Payer: Self-pay | Admitting: Family Medicine

## 2012-09-17 NOTE — Telephone Encounter (Signed)
Refill request for Amlodipine 10 mg, Metoprolol 25 mg, Losartan 50 mg, Lansoprazole 30 and a 90 day supply.

## 2012-09-18 MED ORDER — AMLODIPINE BESYLATE 10 MG PO TABS: 10.0000 mg | ORAL_TABLET | Freq: Every day | ORAL | Status: DC

## 2012-09-18 MED ORDER — METOPROLOL TARTRATE 25 MG PO TABS: 25.0000 mg | ORAL_TABLET | Freq: Two times a day (BID) | ORAL | Status: DC

## 2012-09-18 MED ORDER — LANSOPRAZOLE 30 MG PO CPDR: 30.0000 mg | DELAYED_RELEASE_CAPSULE | Freq: Every day | ORAL | Status: DC

## 2012-09-18 MED ORDER — LOSARTAN POTASSIUM 50 MG PO TABS: 50.0000 mg | ORAL_TABLET | Freq: Every day | ORAL | Status: DC

## 2012-09-18 NOTE — Telephone Encounter (Signed)
I sent all scripts e-scribe.

## 2012-10-06 ENCOUNTER — Telehealth: Payer: Self-pay | Admitting: Family Medicine

## 2012-10-06 DIAGNOSIS — M25529 Pain in unspecified elbow: Secondary | ICD-10-CM

## 2012-10-06 NOTE — Telephone Encounter (Signed)
Referral was done  

## 2012-10-06 NOTE — Telephone Encounter (Signed)
I spoke with pt  

## 2012-10-06 NOTE — Telephone Encounter (Signed)
Patient Information:  Caller Name: [name]  Phone: [phone]  Patient: [name] [name], [name] [name]  Gender: Male  DOB: [birth year]  Age: 47 Years  PCP: Gershon Crane [facility])  Office Follow Up:  Does the office need to follow up with this patient?: Yes  Instructions For The Office: Please call ASAP regarding MD recommendation for optiomal place to be seen for muscle injury.  RN Note:  RN advised to go to ED or UC now but he is reluctant since they may not do needed evaluation/testing.  Requesting referral to orthopod or ortho urgent care since thinks will need a MRI to evaluate. Information noted and sent to office staff for MD recommendation and immediate call back.  Symptoms  Reason For Call & Symptoms: Ruptured tendons in Left arm and "bicep rolled up" while working around home approximately 1500.  Can move fingers and wrist.  Unable to move elbow due to pain. No active bleeding.  Reviewed Health History In EMR: Yes  Reviewed Medications In EMR: Yes  Reviewed Allergies In EMR: Yes  Reviewed Surgeries / Procedures: Yes  Date of Onset of Symptoms: 10/06/2012  Treatments Tried: Ice Pack, shot of alcohol  Treatments Tried Worked: No  Guideline(s) Used:  Arm Injury  Disposition Per Guideline:   Go to ED Now (or to Office with PCP Approval)  Reason For Disposition Reached:   Can't move injured arm at all  Advice Given:  N/A  RN Overrode Recommendation:  Follow Up With Office Later  Please call back immediately regarding  MD recommendation for orthopedic urgent care.

## 2012-10-08 ENCOUNTER — Other Ambulatory Visit (HOSPITAL_COMMUNITY): Payer: Self-pay | Admitting: Orthopedic Surgery

## 2012-10-08 DIAGNOSIS — M25522 Pain in left elbow: Secondary | ICD-10-CM

## 2012-10-13 ENCOUNTER — Ambulatory Visit (HOSPITAL_COMMUNITY)
Admission: RE | Admit: 2012-10-13 | Discharge: 2012-10-13 | Disposition: A | Discharge status: Home / Self Care | Payer: BC Managed Care – PPO | Source: Ambulatory Visit | Attending: Orthopedic Surgery | Admitting: Orthopedic Surgery

## 2012-10-13 DIAGNOSIS — M25522 Pain in left elbow: Secondary | ICD-10-CM

## 2012-10-13 DIAGNOSIS — S53499A Other sprain of unspecified elbow, initial encounter: Secondary | ICD-10-CM | POA: Insufficient documentation

## 2012-10-13 DIAGNOSIS — X500XXA Overexertion from strenuous movement or load, initial encounter: Secondary | ICD-10-CM | POA: Insufficient documentation

## 2012-11-18 ENCOUNTER — Other Ambulatory Visit: Payer: Self-pay | Admitting: Family Medicine

## 2012-11-18 ENCOUNTER — Telehealth: Payer: Self-pay | Admitting: Family Medicine

## 2012-11-18 MED ORDER — ALPRAZOLAM 1 MG PO TABS: 1.0000 mg | ORAL_TABLET | Freq: Two times a day (BID) | ORAL | Status: DC | PRN

## 2012-11-18 NOTE — Telephone Encounter (Signed)
Refill once 

## 2012-11-18 NOTE — Telephone Encounter (Signed)
Pt needs refill on Xanax 1 mg take 1 po bid prn, can we write for 30 day supply? Pt last seen on 05/26/12.

## 2012-11-18 NOTE — Telephone Encounter (Signed)
I called in script 

## 2012-11-29 ENCOUNTER — Other Ambulatory Visit: Payer: Self-pay | Admitting: Family Medicine

## 2012-12-01 NOTE — Telephone Encounter (Signed)
Call in #60 with 5 rf 

## 2012-12-10 ENCOUNTER — Ambulatory Visit (INDEPENDENT_AMBULATORY_CARE_PROVIDER_SITE_OTHER): Payer: BC Managed Care – PPO | Admitting: Family Medicine

## 2012-12-10 ENCOUNTER — Encounter: Payer: Self-pay | Admitting: Family Medicine

## 2012-12-10 VITALS — BP 124/78 | HR 77 | Temp 98.7°F | Wt 226.0 lb

## 2012-12-10 DIAGNOSIS — L989 Disorder of the skin and subcutaneous tissue, unspecified: Secondary | ICD-10-CM

## 2012-12-10 DIAGNOSIS — I10 Essential (primary) hypertension: Secondary | ICD-10-CM

## 2012-12-10 MED ORDER — AMLODIPINE BESYLATE 10 MG PO TABS: 10.0000 mg | ORAL_TABLET | Freq: Every day | ORAL | Status: DC

## 2012-12-10 MED ORDER — LOSARTAN POTASSIUM 50 MG PO TABS: 50.0000 mg | ORAL_TABLET | Freq: Every day | ORAL | Status: DC

## 2012-12-10 MED ORDER — METOPROLOL TARTRATE 25 MG PO TABS: 25.0000 mg | ORAL_TABLET | Freq: Every day | ORAL | Status: DC

## 2012-12-10 NOTE — Progress Notes (Signed)
  Subjective:    Patient ID: [name] [name], male    DOB: 1965-06-18, 47 y.o.   MRN: [id number]  HPI Here to follow up on BP and for some skin lesions. His BP has been stable at home and he feels well. He has had some dry itchy skin on the forehead for a few months. Also 2 weeks ago a red spot came up on his left forearm that has persisted. It does not burn or itch.   Review of Systems  Constitutional: Negative.   Respiratory: Negative.   Cardiovascular: Negative.   Skin: Positive for rash.       Objective:   Physical Exam  Constitutional: He appears well-developed and well-nourished.  Cardiovascular: Normal rate, regular rhythm, normal heart sounds and intact distal pulses.   Pulmonary/Chest: Effort normal and breath sounds normal.  Skin:  The forehead appears normal. The left forearm has a macular red spot about 1 cm across.           Assessment & Plan:  His HTN is stable. Try a moisturizer on the forehead. I am not quite sure how to characterize the forearm lesion. He will try 1% hydrocortisone cream several times a day. Recheck if not better in 2 weeks

## 2012-12-30 ENCOUNTER — Other Ambulatory Visit: Payer: Self-pay | Admitting: Family Medicine

## 2013-03-20 ENCOUNTER — Ambulatory Visit (INDEPENDENT_AMBULATORY_CARE_PROVIDER_SITE_OTHER): Payer: BC Managed Care – PPO | Admitting: Family Medicine

## 2013-03-20 ENCOUNTER — Encounter: Payer: Self-pay | Admitting: Family Medicine

## 2013-03-20 VITALS — BP 124/84 | HR 67 | Temp 99.7°F | Wt 227.0 lb

## 2013-03-20 DIAGNOSIS — Z2089 Contact with and (suspected) exposure to other communicable diseases: Secondary | ICD-10-CM

## 2013-03-20 DIAGNOSIS — F329 Major depressive disorder, single episode, unspecified: Secondary | ICD-10-CM

## 2013-03-20 DIAGNOSIS — Z202 Contact with and (suspected) exposure to infections with a predominantly sexual mode of transmission: Secondary | ICD-10-CM

## 2013-03-20 LAB — CBC WITH DIFFERENTIAL/PLATELET
Basophils Absolute: 0 10*3/uL (ref 0.0–0.1)
HCT: 45 % (ref 39.0–52.0)
Hemoglobin: 15.5 g/dL (ref 13.0–17.0)
Lymphs Abs: 2.1 10*3/uL (ref 0.7–4.0)
MCV: 93.2 fl (ref 78.0–100.0)
Monocytes Relative: 5.9 % (ref 3.0–12.0)
Neutro Abs: 4.1 10*3/uL (ref 1.4–7.7)
RDW: 12.9 % (ref 11.5–14.6)

## 2013-03-20 LAB — HEPATIC FUNCTION PANEL
ALT: 87 U/L — ABNORMAL HIGH (ref 0–53)
AST: 59 U/L — ABNORMAL HIGH (ref 0–37)
Alkaline Phosphatase: 80 U/L (ref 39–117)
Bilirubin, Direct: 0 mg/dL (ref 0.0–0.3)
Total Bilirubin: 1.2 mg/dL (ref 0.3–1.2)

## 2013-03-20 LAB — BASIC METABOLIC PANEL
Chloride: 99 mEq/L (ref 96–112)
Potassium: 4 mEq/L (ref 3.5–5.1)
Sodium: 138 mEq/L (ref 135–145)

## 2013-03-20 LAB — POCT URINALYSIS DIPSTICK
Blood, UA: NEGATIVE
Spec Grav, UA: 1.025
pH, UA: 6.5

## 2013-03-20 LAB — TSH: TSH: 0.65 u[IU]/mL (ref 0.35–5.50)

## 2013-03-20 MED ORDER — BUPROPION HCL ER (XL) 150 MG PO TB24: 150.0000 mg | ORAL_TABLET | Freq: Every day | ORAL | Status: DC

## 2013-03-20 NOTE — Progress Notes (Signed)
Pre visit review using our clinic review tool, if applicable. No additional management support is needed unless otherwise documented below in the visit note. 

## 2013-03-20 NOTE — Progress Notes (Signed)
   Subjective:    Patient ID: [name] [name], male    DOB: 09/28/1965, 47 y.o.   MRN: [id number]  HPI Here for what he thinks is depression. He has been under stress for several years with taking care of his elderly mother and his being out of work. He feels tired all the time, and he has a poor appetite. Also he asks for STD testing because he recently had unprotected sex.    Review of Systems  Constitutional: Positive for fatigue.  Respiratory: Negative.   Cardiovascular: Negative.   Genitourinary: Negative.   Psychiatric/Behavioral: Positive for dysphoric mood and decreased concentration. Negative for suicidal ideas, behavioral problems and agitation. The patient is nervous/anxious.        Objective:   Physical Exam  Constitutional: He is oriented to person, place, and time. He appears well-developed and well-nourished.  Neurological: He is alert and oriented to person, place, and time.  Psychiatric: He has a normal mood and affect. His behavior is normal. Thought content normal.          Assessment & Plan:  Start on Wellbutrin XL 150 mg daily. Get labs. Recheck in 3 weeks

## 2013-03-21 LAB — GC/CHLAMYDIA PROBE AMP, URINE
Chlamydia, Swab/Urine, PCR: NEGATIVE
GC Probe Amp, Urine: NEGATIVE

## 2013-04-25 IMAGING — CT CT ANGIO CHEST
1 of 12 series · 9 of 38 positions shown · IV contrast (APPLIED)
Comparison: None

CLINICAL DATA: Fever and shortness of breath.

CT ANGIOGRAPHY CHEST
TECHNIQUE: Multidetector CT imaging of the chest using the
standard protocol during bolus administration of intravenous
contrast. Multiplanar reconstructed images including MIPs were
obtained and reviewed to evaluate the vascular anatomy.
Contrast: 150mL OMNIPAQUE IOHEXOL 300 MG/ML IJ SOLN

[Series 14: thins for pacs · axial · 0.78mm/px · z∈[+1335,+1544]mm · 9 of 263 slices shown]
[im 27/263  lung]
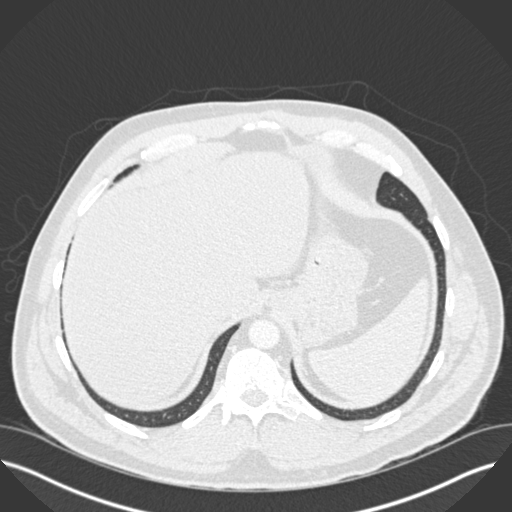
[im 53/263  mediastinal]
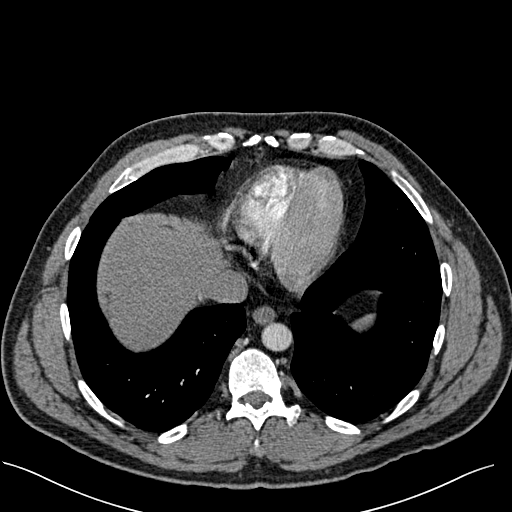
[im 79/263  lung]
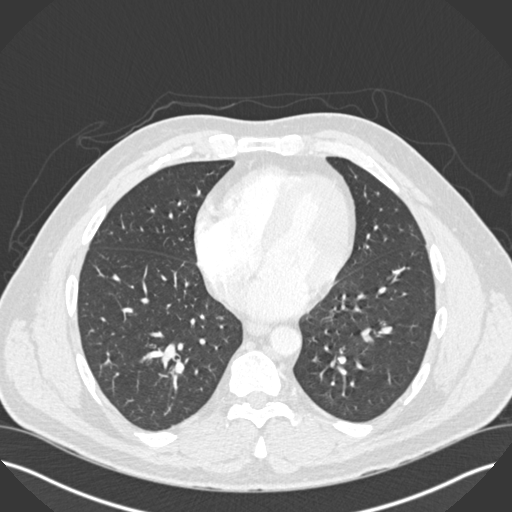
[im 105/263  mediastinal]
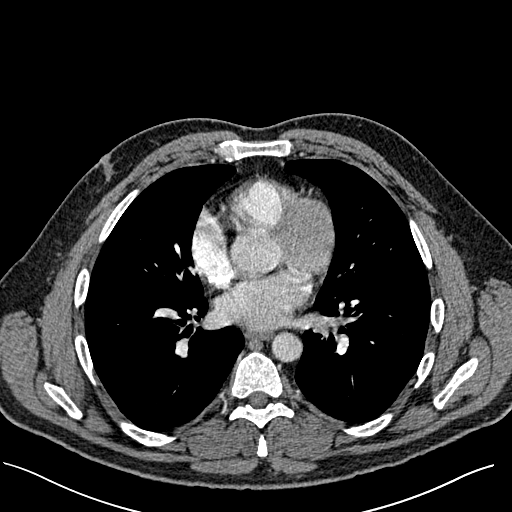
[im 132/263  lung]
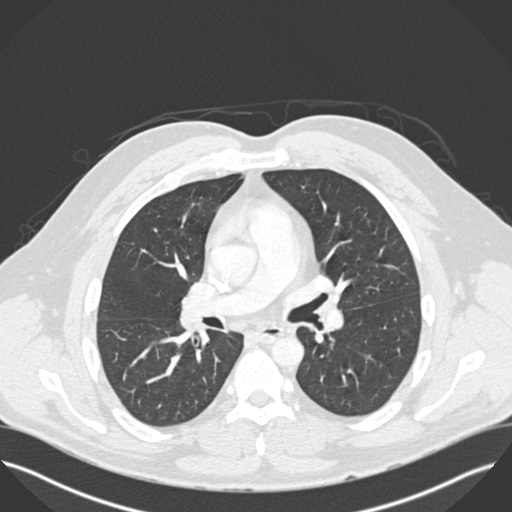
[im 158/263  mediastinal]
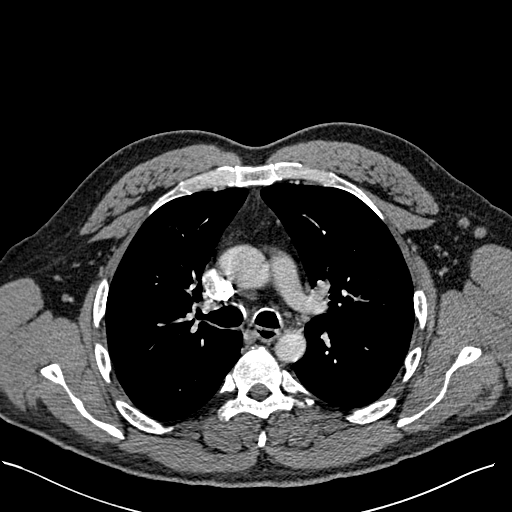
[im 184/263  lung]
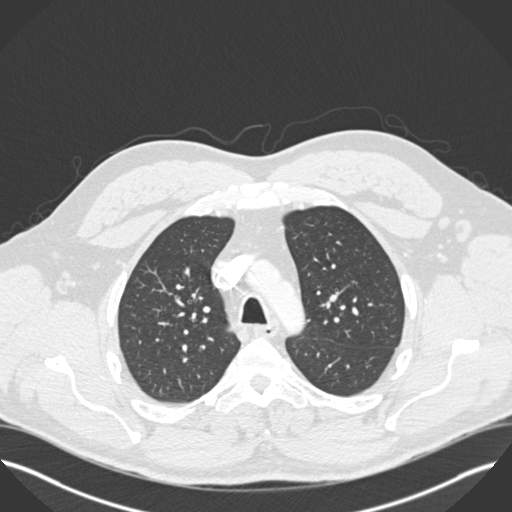
[im 210/263  mediastinal]
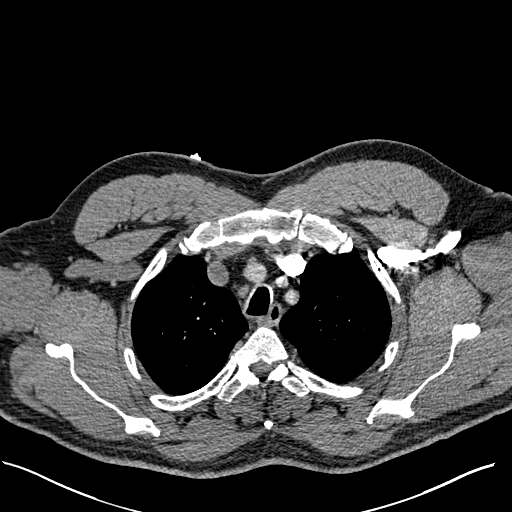
[im 236/263  lung]
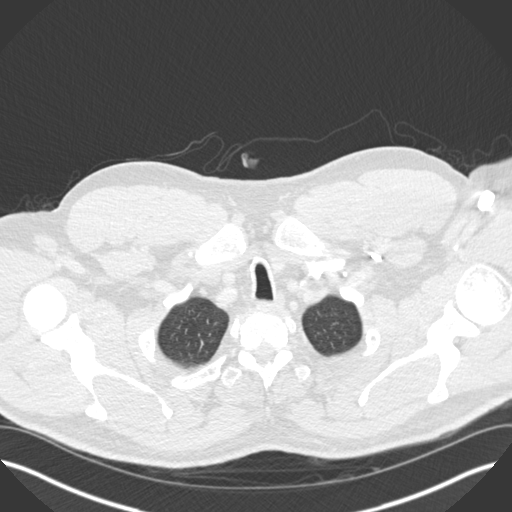

[9 of 38 positions shown; findings below may reference images not displayed]

FINDINGS: No enlarged axillary or supraclavicular lymph nodes.

Borderline right paratracheal lymph node measures 1.1 cm.

The subcarinal lymph node measures 1 cm, image 117.

No pericardial or pleural effusion identified.

Trachea appears patent and is midline.

The lungs are clear.

There is no airspace consolidation.

No suspicious pulmonary nodule or mass.

No abnormal filling defects identified within the main pulmonary
artery or its branches to suggest an acute pulmonary embolus.

Review of the visualized bony structures is unremarkable.
IMPRESSION: 1.  No active cardiopulmonary abnormalities.
2.  No evidence for pulmonary embolus.

## 2013-05-07 ENCOUNTER — Encounter: Payer: Self-pay | Admitting: Family Medicine

## 2013-06-01 ENCOUNTER — Ambulatory Visit (INDEPENDENT_AMBULATORY_CARE_PROVIDER_SITE_OTHER): Payer: BC Managed Care – PPO | Admitting: Family

## 2013-06-01 ENCOUNTER — Encounter: Payer: Self-pay | Admitting: Family

## 2013-06-01 VITALS — BP 120/70 | HR 76 | Wt 234.0 lb

## 2013-06-01 DIAGNOSIS — I10 Essential (primary) hypertension: Secondary | ICD-10-CM

## 2013-06-01 DIAGNOSIS — S46909A Unspecified injury of unspecified muscle, fascia and tendon at shoulder and upper arm level, unspecified arm, initial encounter: Secondary | ICD-10-CM

## 2013-06-01 DIAGNOSIS — S46102A Unspecified injury of muscle, fascia and tendon of long head of biceps, left arm, initial encounter: Secondary | ICD-10-CM

## 2013-06-01 DIAGNOSIS — S4980XA Other specified injuries of shoulder and upper arm, unspecified arm, initial encounter: Secondary | ICD-10-CM

## 2013-06-01 MED ORDER — OXYCODONE-ACETAMINOPHEN 10-325 MG PO TABS: 1.0000 | ORAL_TABLET | Freq: Three times a day (TID) | ORAL | Status: DC | PRN

## 2013-06-01 NOTE — Progress Notes (Signed)
Subjective:    Patient ID: [name] [name], male    DOB: 03/16/1966, 48 y.o.   MRN: [id number]  HPI 48 year old white male, nonsmoker, patient of Dr. Clent RidgesFry is in today with complaints of a possible bicep tendon tear. He was seen July 2014 and had a complete tear of the right biceps tendon was surgically repaired. 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 The pain is very similar to when he tore the bicep tendon last year. Rates the pain a 9/10 worse with movement. Has been taking Tylenol without much relief. Has swelling. Reports pain being sharp with movement and a constant ache without movement.    Review of Systems  Constitutional: Negative.   Respiratory: Negative.   Cardiovascular: Negative.   Endocrine: Negative.   Musculoskeletal:       Pain to left lower arm, swelling, and decreased ROM  Skin: Negative.   Neurological: Negative.   Hematological: Negative.   Psychiatric/Behavioral: Negative.    Past Medical History  Diagnosis Date  . Hypertension   . Hyperlipidemia   . Depression   . Anxiety     History   Social History  . Marital Status: Married    Spouse Name: N/A    Number of Children: N/A  . Years of Education: N/A   Occupational History  . Not on file.   Social History Main Topics  . Smoking status: Never Smoker   . Smokeless tobacco: Never Used  . Alcohol Use: 0.0 oz/week     Comment: occ  . Drug Use: No  . Sexual Activity: Not on file   Other Topics Concern  . Not on file   Social History Narrative  . No narrative on file    Past Surgical History  Procedure Laterality Date  . Appendectomy    . Hernia repair    . Achilles tendon repair      Family History  Problem Relation Age of Onset  . Adopted: Yes    No Known Allergies  Current Outpatient Prescriptions on File Prior to Visit  Medication Sig Dispense Refill  . ALPRAZolam (XANAX) 1  MG tablet TAKE 1 TABLET TWICE A DAY AS NEEDED  60 tablet  5  . amLODipine (NORVASC) 10 MG tablet Take 1 tablet (10 mg total) by mouth daily.  90 tablet  3  . buPROPion (WELLBUTRIN XL) 150 MG 24 hr tablet Take 1 tablet (150 mg total) by mouth daily.  30 tablet  2  . lansoprazole (PREVACID) 30 MG capsule TAKE ONE CAPSULE BY MOUTH EVERY DAY  90 capsule  1  . losartan (COZAAR) 50 MG tablet Take 1 tablet (50 mg total) by mouth daily.  90 tablet  3  . metoprolol tartrate (LOPRESSOR) 25 MG tablet Take 1 tablet (25 mg total) by mouth daily.  90 tablet  3   No current facility-administered medications on file prior to visit.    BP 120/70  Pulse 76  Wt 234 lb (106.142 kg)chart    Objective:   Physical Exam  Constitutional: He is oriented to person, place, and time. He appears well-developed and well-nourished.  Neck: Normal range of motion. Neck supple.  Cardiovascular: Normal rate, regular rhythm and normal heart sounds.   Pulmonary/Chest: Effort normal and breath sounds normal.  Musculoskeletal: He exhibits edema and tenderness.  Right proximal bicep tendon tender to palpation. Moderate  swelling. Significantly decreased ROM due to pain. Unable to flex, extend, no pronation or supination. Grip strength decreased. Radial pulse 2/2.   Neurological: He is alert and oriented to person, place, and time.  Skin: Skin is warm and dry.  Psychiatric: He has a normal mood and affect.          Assessment & Plan:  [name] [name] was seen today for no specified reason.  Diagnoses and associated orders for this visit:  Injury of tendon of long head of left biceps  Unspecified essential hypertension  Other Orders - oxyCODONE-acetaminophen (PERCOCET) 10-325 MG per tablet; Take 1 tablet by mouth every 8 (eight) hours as needed for pain.   Keep appt tomorrow as scheduled with surgeon. Pain meds provided.

## 2013-06-01 NOTE — Patient Instructions (Signed)
Biceps Tendon Tendinitis (Distal)  with Rehab  Tendinitis involves inflammation and pain over the affected tendon. The distal biceps tendon (near the elbow) is vulnerable to tendinitis. Distal biceps tendonitis is usually due to the bony bump near the elbow (bicipital tuberosity) causing increased friction over the tendon. The biceps tendon attaches the biceps muscle to one bone in the elbow and two in the shoulder. It is important for proper function of the elbow and for turning the palm upward (supination).  SYMPTOMS   · Pain, aching, tenderness, and sometimes warmth or redness over the front of the elbow.  · Pain when bending the elbow or turning the palm up, using the wrist, especially if performed against resistance.  · Crackling sound (crepitation) when the tendon or elbow is moved or touched.  CAUSES   The symptoms of biceps tendonitis are due to inflammation of the tendon. Inflammation may be caused by:  · Strain from sudden increase in amount or intensity of activity.  · Direct blow or injury to the elbow (uncommon).  · Overuse or repetitive elbow bending or wrist rotation, particularly when turning the palm up, or with elbow hyperextension.  RISK INCREASES WITH:  · Sports that involve contact or overhead arm activity (throwing sports, gymnastics, weightlifting, bodybuilding, rock climbing).  · Heavy labor.  · Poor strength and flexibility.  · Failure to warm up properly before activity.  · Injury to other structures of the elbow.  · Restraint of the elbow.  PREVENTION  · Warm up and stretch properly before activity.  · Allow time for recovery between activities.  · Maintain physical fitness:  · Strength, flexibility, and endurance.  · Cardiovascular fitness.  · Learn and use proper exercise technique.  PROGNOSIS   With proper treatment, biceps tendon tendonitis is usually curable within 6 weeks.   RELATED COMPLICATIONS   · Longer healing time if not properly treated or if not given enough time to  heal.  · Chronically inflamed tendon that causes persistent pain with activity, that may progress to constant pain and potentially rupture of the tendon.  · Recurring symptoms, especially if activity is resumed too soon, with overuse or with poor technique.  TREATMENT   Treatment first involves ice and medicine to reduce pain and inflammation. Modify activities that cause pain, to reduce the chances of causing the condition to get worse. Strengthening and stretching exercises should be performed to promote proper use of the muscles of the elbow. These exercise may be performed at home or with a therapist. Other treatments may be given such as ultrasound or heat therapy. Surgery is usually not recommended.   MEDICATION  · If pain medicine is needed, nonsteroidal anti-inflammatory medicines (aspirin and ibuprofen), or other minor pain relievers (acetaminophen), are often advised.  · Do not take pain relieving medication for 7 days before surgery.  · Prescription pain relievers may be given if your caregiver thinks they are needed. Use only as directed and only as much as you need.  HEAT AND COLD:   · Cold treatment (icing) should be applied for 10 to 15 minutes every 2 to 3 hours for inflammation and pain, and immediately after activity that aggravates your symptoms. Use ice packs or an ice massage.  · Heat treatment may be used before performing stretching and strengthening activities prescribed by your caregiver, physical therapist, or athletic trainer. Use a heat pack or a warm water soak.  SEEK MEDICAL CARE IF:   · Symptoms get worse or do   not improve in 2 weeks, despite treatment.  · New, unexplained symptoms develop. (Drugs used in treatment may produce side effects.)  EXERCISES   RANGE OF MOTION (ROM) AND STRETCHING EXERCISES - Biceps Tendon Tendinitis (Distal)  These exercises may help you when beginning to rehabilitate your injury. Your symptoms may go away with or without further involvement from your  physician, physical therapist, or athletic trainer. While completing these exercises, remember:   · Restoring tissue flexibility helps normal motion to return to the joints. This allows healthier, less painful movement and activity.  · An effective stretch should be held for at least 30 seconds.  · A stretch should never be painful. You should only feel a gentle lengthening or release in the stretched tissue.  STRETCH  Elbow Flexors   · Lie on a firm bed or countertop on your back. Be sure that you are in a comfortable position which will allow you to relax your arm muscles.  · Place a folded towel under your right / left upper arm, so that your elbow and shoulder are at the same height. Extend your arm; your elbow should not rest on the bed or towel.  · Allow the weight of your hand to straighten your elbow. Keep your arm and chest muscles relaxed. Your caretaker may ask you to increase the intensity of your stretch by adding a small wrist or hand weight.  · Hold for __________ seconds. You should feel a stretch on the inside of your elbow. Slowly return to the starting position.  Repeat __________ times. Complete this exercise __________ times per day.  RANGE OF MOTION  Supination, Active   · Stand or sit with your elbows at your side. Bend your right / left elbow to 90 degrees.  · Turn your palm upward until you feel a gentle stretch on the inside of your forearm.  · Hold this position for __________ seconds. Slowly release and return to the starting position.  Repeat __________ times. Complete this stretch __________ times per day.   RANGE OF MOTION  Pronation, Active   · Stand or sit with your elbows at your side. Bend your right / left elbow to 90 degrees.  · Turn your palm downward until you feel a gentle stretch on the top of your forearm.  · Hold this position for __________ seconds. Slowly release and return to the starting position.  Repeat __________ times. Complete this stretch __________ times per day.    STRENGTHENING EXERCISES - Biceps Tendon Tendinitis (Distal)  These exercises may help you when beginning to rehabilitate your injury. They may resolve your symptoms with or without further involvement from your physician, physical therapist or athletic trainer. While completing these exercises, remember:   · Muscles can gain both the endurance and the strength needed for everyday activities through controlled exercises.  · Complete these exercises as instructed by your physician, physical therapist or athletic trainer. Increase the resistance and repetitions only as guided.  · You may experience muscle soreness or fatigue, but the pain or discomfort you are trying to eliminate should never get worse during these exercises. If this pain does get worse, stop and make sure you are following the directions exactly. If the pain is still present after adjustments, discontinue the exercise until you can discuss the trouble with your clinician.  STRENGTH - Elbow Flexors, Isometric   · Stand or sit upright on a firm surface. Place your right / left arm so that your hand is palm-up   and at the height of your waist.  · Place your opposite hand on top of your forearm. Gently push down as your right / left arm resists. Push as hard as you can with both arms without causing any pain or movement at your right / left elbow. Hold this stationary position for __________ seconds.  · Gradually release the tension in both arms. Allow your muscles to relax completely before repeating.  Repeat __________ times. Complete this exercise __________ times per day.  STRENGTH  Forearm Supinators   · Sit with your right / left forearm supported on a table, keeping your elbow below shoulder height. Rest your hand over the edge, palm down.  · Gently grip a hammer or a soup ladle.  · Without moving your elbow, slowly turn your palm and hand upward to a "thumbs-up" position.  · Hold this position for __________ seconds. Slowly return to the starting  position.  Repeat __________ times. Complete this exercise __________ times per day.   STRENGTH  Forearm Pronators   · Sit with your right / left forearm supported on a table, keeping your elbow below shoulder height. Rest your hand over the edge, palm up.  · Gently grip a hammer or a soup ladle.  · Without moving your elbow, slowly turn your palm and hand upward to a "thumbs-up" position.  · Hold this position for __________ seconds. Slowly return to the starting position.  Repeat __________ times. Complete this exercise __________ times per day.   STRENGTH  Elbow Flexors, Supinated  · With good posture, stand or sit on a firm chair without armrests. Allow your right / left arm to rest at your side with your palm facing forward.  · Holding a __________ weight, or gripping a rubber exercise band or tubing, bring your hand toward your shoulder.  · Allow your muscles to control the resistance as your hand returns to your side.  Repeat __________ times. Complete this exercise __________ times per day.   STRENGTH  Elbow Flexors, Neutral  · With good posture, stand or sit on a firm chair without armrests. Allow your right / left arm to rest at your side with your thumb facing forward.  · Holding a __________ weight, or gripping a rubber exercise band or tubing, bring your hand toward your shoulder.  · Allow your muscles to control the resistance as your hand returns to your side.  Repeat __________ times. Complete this exercise __________ times per day.   Document Released: 03/26/2005 Document Revised: 06/18/2011 Document Reviewed: 07/08/2008  [employer]® Patient Information ©2014 [employer].

## 2013-06-01 NOTE — Progress Notes (Signed)
Pre visit review using our clinic review tool, if applicable. No additional management support is needed unless otherwise documented below in the visit note. 

## 2013-06-02 ENCOUNTER — Telehealth: Payer: Self-pay | Admitting: Family Medicine

## 2013-06-02 NOTE — Telephone Encounter (Signed)
Relevant patient education assigned to patient using Emmi. ° °

## 2013-06-10 ENCOUNTER — Other Ambulatory Visit: Payer: Self-pay | Admitting: Family Medicine

## 2013-06-10 NOTE — Telephone Encounter (Signed)
Call in Xanax #60 with 5 rf, also Prevacid for one year

## 2013-06-12 NOTE — Telephone Encounter (Signed)
Xanax called in for 6 months and Prevacid sent in electronically for 1 year.

## 2013-06-24 ENCOUNTER — Telehealth: Payer: Self-pay | Admitting: Family Medicine

## 2013-06-24 NOTE — Telephone Encounter (Signed)
[id number] - [place], [place] - [address] [facility] [address] is requesting re-fill on buPROPion (WELLBUTRIN XL) 150 MG 24 hr tablet

## 2013-06-25 MED ORDER — BUPROPION HCL ER (XL) 150 MG PO TB24: 150.0000 mg | ORAL_TABLET | Freq: Every day | ORAL | Status: DC

## 2013-06-25 NOTE — Telephone Encounter (Signed)
rx sent

## 2013-06-25 NOTE — Telephone Encounter (Signed)
Okay for one year  

## 2013-10-28 ENCOUNTER — Ambulatory Visit (INDEPENDENT_AMBULATORY_CARE_PROVIDER_SITE_OTHER): Payer: BC Managed Care – PPO | Admitting: Family Medicine

## 2013-10-28 ENCOUNTER — Encounter: Payer: Self-pay | Admitting: Family Medicine

## 2013-10-28 VITALS — BP 112/66 | HR 73 | Temp 98.4°F | Ht 73.5 in | Wt 220.0 lb

## 2013-10-28 DIAGNOSIS — F418 Other specified anxiety disorders: Secondary | ICD-10-CM | POA: Insufficient documentation

## 2013-10-28 DIAGNOSIS — I10 Essential (primary) hypertension: Secondary | ICD-10-CM | POA: Insufficient documentation

## 2013-10-28 DIAGNOSIS — E785 Hyperlipidemia, unspecified: Secondary | ICD-10-CM

## 2013-10-28 DIAGNOSIS — K219 Gastro-esophageal reflux disease without esophagitis: Secondary | ICD-10-CM

## 2013-10-28 DIAGNOSIS — L409 Psoriasis, unspecified: Secondary | ICD-10-CM

## 2013-10-28 DIAGNOSIS — F341 Dysthymic disorder: Secondary | ICD-10-CM

## 2013-10-28 DIAGNOSIS — L408 Other psoriasis: Secondary | ICD-10-CM

## 2013-10-28 MED ORDER — HALOBETASOL PROPIONATE 0.05 % EX CREA: TOPICAL_CREAM | Freq: Two times a day (BID) | CUTANEOUS | Status: DC

## 2013-10-28 NOTE — Progress Notes (Signed)
   Subjective:    Patient ID: [name] [name], male    DOB: [birth year], 48 y.o.   MRN: [id number]  HPI Here to check his BP and to look at an itchy patch of skin on the left lower leg that appeared 5 months ago. It is not changing in appearance.    Review of Systems  Constitutional: Negative.   Respiratory: Negative.   Cardiovascular: Negative.   Skin: Positive for rash.       Objective:   Physical Exam  Constitutional: He is oriented to person, place, and time. He appears well-nourished.  Cardiovascular: Normal rate, regular rhythm, normal heart sounds and intact distal pulses.   Pulmonary/Chest: Effort normal and breath sounds normal.  Neurological: He is alert and oriented to person, place, and time.  Skin:  The left lower leg has a 2 cm patch of macular scaly red skin           Assessment & Plan:  This appears to be either eczema or psoriasis. Try halobetasol cream bid. His HTN is stable.

## 2013-10-28 NOTE — Progress Notes (Signed)
Pre visit review using our clinic review tool, if applicable. No additional management support is needed unless otherwise documented below in the visit note. 

## 2014-01-04 ENCOUNTER — Telehealth: Payer: Self-pay | Admitting: Family Medicine

## 2014-01-04 ENCOUNTER — Other Ambulatory Visit: Payer: Self-pay | Admitting: Family Medicine

## 2014-01-04 NOTE — Telephone Encounter (Signed)
[id number] - [place] - [address] AT [address] OF [facility] [address] is requesting re-fill on ALPRAZolam (XANAX) 1 MG tablet

## 2014-01-04 NOTE — Telephone Encounter (Signed)
Call in #60 with 5 rf 

## 2014-01-06 MED ORDER — ALPRAZOLAM 1 MG PO TABS: ORAL_TABLET | ORAL | Status: DC

## 2014-01-06 NOTE — Telephone Encounter (Signed)
I called in script 

## 2014-01-06 NOTE — Telephone Encounter (Signed)
Pt called about the re-fill request below.

## 2014-01-06 NOTE — Telephone Encounter (Signed)
Zella BallRobin, from [facility] calling to check status of refill, informed her md approved #60 with 5 refills.

## 2014-01-06 NOTE — Addendum Note (Signed)
Addended by: Aniceto BossNIMMONS, Valeen Borys A on: 01/06/2014 03:08 PM   Modules accepted: Orders

## 2014-01-13 ENCOUNTER — Encounter: Payer: Self-pay | Admitting: Family Medicine

## 2014-01-13 ENCOUNTER — Ambulatory Visit (INDEPENDENT_AMBULATORY_CARE_PROVIDER_SITE_OTHER): Payer: BC Managed Care – PPO | Admitting: Family Medicine

## 2014-01-13 VITALS — BP 111/74 | HR 65 | Temp 99.1°F | Ht 73.5 in | Wt 220.0 lb

## 2014-01-13 DIAGNOSIS — J020 Streptococcal pharyngitis: Secondary | ICD-10-CM

## 2014-01-13 DIAGNOSIS — I10 Essential (primary) hypertension: Secondary | ICD-10-CM

## 2014-01-13 DIAGNOSIS — J209 Acute bronchitis, unspecified: Secondary | ICD-10-CM

## 2014-01-13 LAB — HEPATIC FUNCTION PANEL
ALT: 41 U/L (ref 0–53)
AST: 39 U/L — ABNORMAL HIGH (ref 0–37)
Albumin: 4.1 g/dL (ref 3.5–5.2)
Alkaline Phosphatase: 70 U/L (ref 39–117)
BILIRUBIN TOTAL: 1 mg/dL (ref 0.2–1.2)
Bilirubin, Direct: 0.2 mg/dL (ref 0.0–0.3)
Total Protein: 8.7 g/dL — ABNORMAL HIGH (ref 6.0–8.3)

## 2014-01-13 LAB — CBC WITH DIFFERENTIAL/PLATELET
Basophils Absolute: 0 10*3/uL (ref 0.0–0.1)
Basophils Relative: 0.4 % (ref 0.0–3.0)
Eosinophils Absolute: 0.2 10*3/uL (ref 0.0–0.7)
Eosinophils Relative: 2 % (ref 0.0–5.0)
HEMATOCRIT: 44.9 % (ref 39.0–52.0)
HEMOGLOBIN: 15.2 g/dL (ref 13.0–17.0)
Lymphocytes Relative: 25 % (ref 12.0–46.0)
Lymphs Abs: 2 10*3/uL (ref 0.7–4.0)
MCHC: 33.8 g/dL (ref 30.0–36.0)
MCV: 94.4 fl (ref 78.0–100.0)
MONOS PCT: 8.8 % (ref 3.0–12.0)
Monocytes Absolute: 0.7 10*3/uL (ref 0.1–1.0)
NEUTROS ABS: 5.1 10*3/uL (ref 1.4–7.7)
Neutrophils Relative %: 63.8 % (ref 43.0–77.0)
PLATELETS: 188 10*3/uL (ref 150.0–400.0)
RBC: 4.76 Mil/uL (ref 4.22–5.81)
RDW: 12.6 % (ref 11.5–15.5)
WBC: 8 10*3/uL (ref 4.0–10.5)

## 2014-01-13 LAB — BASIC METABOLIC PANEL
BUN: 16 mg/dL (ref 6–23)
CO2: 24 mEq/L (ref 19–32)
CREATININE: 1.2 mg/dL (ref 0.4–1.5)
Calcium: 9.8 mg/dL (ref 8.4–10.5)
Chloride: 99 mEq/L (ref 96–112)
GFR: 69.65 mL/min (ref >60.00)
Glucose, Bld: 92 mg/dL (ref 70–99)
Potassium: 4.8 mEq/L (ref 3.5–5.1)
Sodium: 136 mEq/L (ref 135–145)

## 2014-01-13 LAB — LIPID PANEL
CHOL/HDL RATIO: 5
Cholesterol: 258 mg/dL — ABNORMAL HIGH (ref 0–200)
HDL: 50 mg/dL (ref >39.00)
LDL CALC: 182 mg/dL — AB (ref 0–99)
NONHDL: 208
Triglycerides: 129 mg/dL (ref 0.0–149.0)
VLDL: 25.8 mg/dL (ref 0.0–40.0)

## 2014-01-13 LAB — TSH: TSH: 2.17 u[IU]/mL (ref 0.35–4.50)

## 2014-01-13 MED ORDER — AMOXICILLIN-POT CLAVULANATE 875-125 MG PO TABS: 1.0000 | ORAL_TABLET | Freq: Two times a day (BID) | ORAL | Status: DC

## 2014-01-13 NOTE — Progress Notes (Signed)
   Subjective:    Patient ID: [name] [name], male    DOB: 1965-07-07, 48 y.o.   MRN: [id number]  HPI Here for several things. First he is concerned about his BP and asks for labs to make sure he is "okay on the inside". His BP at home has been stable. He is worried about possible thickening of his heart muscle from his years of uncontrolled HTN. He denies any chest pain or SOB. Also for 5 days he has had a HA, sinus pressure, ST, and a dry cough.    Review of Systems  Constitutional: Negative.   HENT: Positive for congestion, postnasal drip and sinus pressure.   Eyes: Negative.   Respiratory: Positive for cough.   Cardiovascular: Negative.        Objective:   Physical Exam  Constitutional: He appears well-developed and well-nourished.  HENT:  Right Ear: External ear normal.  Left Ear: External ear normal.  Nose: Nose normal.  Mouth/Throat: Oropharynx is clear and moist.  Eyes: Conjunctivae are normal.  Neck: Neck supple. No thyromegaly present.  Cardiovascular: Normal rate, regular rhythm, normal heart sounds and intact distal pulses.   Pulmonary/Chest: Effort normal. No respiratory distress. He has no wheezes. He has no rales.  Scattered rhonchi   Musculoskeletal: He exhibits no edema.  Lymphadenopathy:    He has no cervical adenopathy.          Assessment & Plan:  Treat the bronchitis with Augmentin. Get labs today. Set up an ECHO.

## 2014-01-13 NOTE — Progress Notes (Signed)
Pre visit review using our clinic review tool, if applicable. No additional management support is needed unless otherwise documented below in the visit note. 

## 2014-01-15 ENCOUNTER — Other Ambulatory Visit (HOSPITAL_COMMUNITY): Payer: BC Managed Care – PPO

## 2014-01-18 ENCOUNTER — Encounter (HOSPITAL_COMMUNITY): Payer: Self-pay | Admitting: Family Medicine

## 2014-01-22 ENCOUNTER — Ambulatory Visit (HOSPITAL_COMMUNITY): Payer: BC Managed Care – PPO | Attending: Cardiovascular Disease | Admitting: Radiology

## 2014-01-22 DIAGNOSIS — I10 Essential (primary) hypertension: Secondary | ICD-10-CM | POA: Insufficient documentation

## 2014-01-22 DIAGNOSIS — R079 Chest pain, unspecified: Secondary | ICD-10-CM | POA: Insufficient documentation

## 2014-01-22 NOTE — Progress Notes (Signed)
Echocardiogram performed.  

## 2014-02-08 ENCOUNTER — Other Ambulatory Visit: Payer: Self-pay | Admitting: Family Medicine

## 2014-04-20 ENCOUNTER — Telehealth: Payer: Self-pay | Admitting: Family Medicine

## 2014-04-20 NOTE — Telephone Encounter (Signed)
done

## 2014-04-20 NOTE — Telephone Encounter (Signed)
I faxed letter to below number and left a voice message for pt.

## 2014-04-20 NOTE — Telephone Encounter (Signed)
Pt needs a letter fax to [employer] at [phone]  That he is on xanax. Pt failed drug test due to taking xanax. Please call pt at [phone] if any question. Please fax letter no later than tomorrow pt job is on the line. Please call pt once letter has been faxed

## 2014-07-12 ENCOUNTER — Other Ambulatory Visit: Payer: Self-pay | Admitting: Family Medicine

## 2014-07-25 ENCOUNTER — Telehealth: Payer: Self-pay | Admitting: Family Medicine

## 2014-07-26 NOTE — Telephone Encounter (Signed)
[facility] request for ALPRAZolam (XANAX) 1 MG tablet

## 2014-07-27 NOTE — Telephone Encounter (Signed)
Call in #60 with 5 rf 

## 2014-07-30 ENCOUNTER — Other Ambulatory Visit: Payer: Self-pay | Admitting: Family Medicine

## 2014-07-30 MED ORDER — ALPRAZOLAM 1 MG PO TABS: ORAL_TABLET | ORAL | Status: DC

## 2014-07-30 NOTE — Addendum Note (Signed)
Addended by: Aniceto BossNIMMONS, SYLVIA A on: 07/30/2014 05:05 PM   Modules accepted: Orders

## 2014-07-30 NOTE — Telephone Encounter (Signed)
I called in script 

## 2014-07-30 NOTE — Telephone Encounter (Signed)
Pharm has no record of this being called in. Can you pls resend? [facility]

## 2014-08-19 ENCOUNTER — Ambulatory Visit (INDEPENDENT_AMBULATORY_CARE_PROVIDER_SITE_OTHER): Payer: BLUE CROSS/BLUE SHIELD | Admitting: Family Medicine

## 2014-08-19 ENCOUNTER — Encounter: Payer: Self-pay | Admitting: Family Medicine

## 2014-08-19 VITALS — BP 106/67 | HR 59 | Temp 98.3°F | Ht 73.5 in | Wt 214.0 lb

## 2014-08-19 DIAGNOSIS — S39012A Strain of muscle, fascia and tendon of lower back, initial encounter: Secondary | ICD-10-CM

## 2014-08-19 MED ORDER — CYCLOBENZAPRINE HCL 10 MG PO TABS: 10.0000 mg | ORAL_TABLET | Freq: Three times a day (TID) | ORAL | Status: AC | PRN

## 2014-08-19 MED ORDER — OXYCODONE-ACETAMINOPHEN 5-325 MG PO TABS: 1.0000 | ORAL_TABLET | Freq: Three times a day (TID) | ORAL | Status: DC | PRN

## 2014-08-19 MED ORDER — METHYLPREDNISOLONE 4 MG PO TBPK: ORAL_TABLET | ORAL | Status: DC

## 2014-08-19 NOTE — Progress Notes (Signed)
Pre visit review using our clinic review tool, if applicable. No additional management support is needed unless otherwise documented below in the visit note. 

## 2014-08-19 NOTE — Progress Notes (Signed)
   Subjective:    Patient ID: [name] [name], male    DOB: 03-06-1966, 49 y.o.   MRN: [id number]  HPI Here for low back pain that started 4 days ago. No specific hx of trauma but he has been putting up drywall this week. The pain does not radiate to the legs. He has seen a chiropractor a few times. Using heat and Ibuprofen.    Review of Systems  Constitutional: Negative.   Musculoskeletal: Positive for back pain.       Objective:   Physical Exam  Constitutional: He appears well-developed and well-nourished.  In pain  Musculoskeletal:  He has a lot of spasm in the lower back and this area is tender. ROM is quite limited.           Assessment & Plan:  Low back strain. Use Flexeril, Percocet, and a Medrol dose pack. Recheck prn

## 2014-08-20 ENCOUNTER — Other Ambulatory Visit: Payer: Self-pay | Admitting: Family Medicine

## 2014-08-27 ENCOUNTER — Ambulatory Visit: Payer: BLUE CROSS/BLUE SHIELD | Admitting: Family Medicine

## 2014-08-30 ENCOUNTER — Ambulatory Visit: Payer: BLUE CROSS/BLUE SHIELD | Admitting: Family Medicine

## 2014-09-27 ENCOUNTER — Encounter: Payer: Self-pay | Admitting: Family Medicine

## 2014-09-27 ENCOUNTER — Ambulatory Visit (INDEPENDENT_AMBULATORY_CARE_PROVIDER_SITE_OTHER): Payer: BLUE CROSS/BLUE SHIELD | Admitting: Family Medicine

## 2014-09-27 VITALS — BP 125/80 | HR 79 | Temp 98.3°F | Ht 73.5 in | Wt 213.0 lb

## 2014-09-27 DIAGNOSIS — F418 Other specified anxiety disorders: Secondary | ICD-10-CM

## 2014-09-27 MED ORDER — ESCITALOPRAM OXALATE 20 MG PO TABS: 20.0000 mg | ORAL_TABLET | Freq: Every day | ORAL | Status: DC

## 2014-09-27 NOTE — Progress Notes (Signed)
Pre visit review using our clinic review tool, if applicable. No additional management support is needed unless otherwise documented below in the visit note. 

## 2014-09-27 NOTE — Progress Notes (Signed)
   Subjective:    Patient ID: [name] [name], male    DOB: [birth year], 49 y.o.   MRN: [id number]  HPI Here asking for advice about his anxiety. He has been taking Xanax 2 or 3 times a day but this is not helping much. He has some very difficult family situations he is dealing with now, and he worries about things constantly. His mother os dying and is in a Hopsice program., and he still cares for her. He took Zoloft in the past but he says this never helped him much.    Review of Systems  Constitutional: Negative.   Respiratory: Negative.   Cardiovascular: Negative.   Neurological: Negative.   Psychiatric/Behavioral: Positive for sleep disturbance, dysphoric mood and decreased concentration. Negative for suicidal ideas, behavioral problems, confusion, self-injury and agitation. The patient is nervous/anxious.        Objective:   Physical Exam  Constitutional: He is oriented to person, place, and time. He appears well-developed and well-nourished.  Cardiovascular: Normal rate, normal heart sounds and intact distal pulses.   Pulmonary/Chest: Effort normal and breath sounds normal.  Neurological: He is alert and oriented to person, place, and time.  Psychiatric: He has a normal mood and affect. His behavior is normal. Thought content normal.          Assessment & Plan:  Anxiety. Start on Lexapro 20 mg day and he can still take Xanax prn. Recheck one month

## 2014-10-19 ENCOUNTER — Other Ambulatory Visit: Payer: Self-pay | Admitting: Family Medicine

## 2014-10-20 ENCOUNTER — Other Ambulatory Visit: Payer: Self-pay

## 2014-11-30 ENCOUNTER — Telehealth: Payer: Self-pay | Admitting: Family Medicine

## 2014-11-30 NOTE — Telephone Encounter (Signed)
Patient Name: [name] [name] DOB: July 22, 1965 Initial Comment Caller states he has poison ivy, has appt tomorrow, wants care advice till then Nurse Assessment Nurse: Charna Elizabeth, RN, Lynden Ang Date/Time (Eastern Time): 11/30/2014 12:36:42 PM Confirm and document reason for call. If symptomatic, describe symptoms. ---Caller states he developed a poison ivy rash on his right arm about a week ago. No fever. No severe breathing or swallowing difficulty. Has the patient traveled out of the country within the last 30 days? ---No Does the patient require triage? ---Yes Related visit to physician within the last 2 weeks? ---No Does the PT have any chronic conditions? (i.e. diabetes, asthma, etc.) ---Yes List chronic conditions. ---High Blood Pressure, PTSD Guidelines Guideline Title Affirmed Question Affirmed Notes Poison Ivy - [place] SEVERE itching (e.g., interferes with sleep or normal activities) Final Disposition User See Physician within 24 Hours [place], RN, [facility] has an appointment scheduled tomorrow with Dr. Clent Ridges. Referrals REFERRED TO PCP OFFICE Disagree/Comply: Comply

## 2014-12-01 ENCOUNTER — Encounter: Payer: Self-pay | Admitting: Family Medicine

## 2014-12-01 ENCOUNTER — Ambulatory Visit (INDEPENDENT_AMBULATORY_CARE_PROVIDER_SITE_OTHER): Payer: BLUE CROSS/BLUE SHIELD | Admitting: Family Medicine

## 2014-12-01 VITALS — BP 123/79 | HR 63 | Temp 98.9°F | Ht 73.5 in | Wt 207.0 lb

## 2014-12-01 DIAGNOSIS — L259 Unspecified contact dermatitis, unspecified cause: Secondary | ICD-10-CM | POA: Diagnosis not present

## 2014-12-01 MED ORDER — BETAMETHASONE DIPROPIONATE 0.05 % EX CREA: TOPICAL_CREAM | Freq: Two times a day (BID) | CUTANEOUS | Status: AC

## 2014-12-01 MED ORDER — METHYLPREDNISOLONE ACETATE 80 MG/ML IJ SUSP
160.0000 mg | Freq: Once | INTRAMUSCULAR | Status: AC
  Administered 2014-12-01: 160 mg via INTRAMUSCULAR

## 2014-12-01 NOTE — Progress Notes (Signed)
   Subjective:    Patient ID: [name] [name], male    DOB: [birth year], 49 y.o.   MRN: [id number]  HPI Here for one week of itchy rashes on the arms and legs. The worst area is on the right forearm. Using Benadryl.    Review of Systems  Constitutional: Negative.   Respiratory: Negative.   Cardiovascular: Negative.   Skin: Positive for rash.       Objective:   Physical Exam  Constitutional: He appears well-developed and well-nourished.  Cardiovascular: Normal rate, regular rhythm, normal heart sounds and intact distal pulses.   Pulmonary/Chest: Effort normal and breath sounds normal.  Skin:  The right forearm has several patches of red vesicles          Assessment & Plan:  Contact dermatitis. Given a steroid shot and use topical Diprolene cream.

## 2014-12-01 NOTE — Addendum Note (Signed)
Addended by: Aniceto Boss A on: 12/01/2014 10:56 AM   Modules accepted: Orders

## 2014-12-01 NOTE — Progress Notes (Signed)
Pre visit review using our clinic review tool, if applicable. No additional management support is needed unless otherwise documented below in the visit note. 

## 2014-12-27 ENCOUNTER — Other Ambulatory Visit: Payer: Self-pay | Admitting: Family Medicine

## 2015-01-31 ENCOUNTER — Other Ambulatory Visit: Payer: Self-pay | Admitting: Family Medicine

## 2015-02-01 NOTE — Telephone Encounter (Signed)
Refill Metoprolol and Amlodipine for one year. Refill Xanax for 6 months

## 2015-02-07 ENCOUNTER — Telehealth: Payer: Self-pay | Admitting: Family Medicine

## 2015-02-07 NOTE — Telephone Encounter (Signed)
Pt needs new rx alprazolam 1 mg #180 for 90 day supply [facility] #[phone] [place].

## 2015-02-08 NOTE — Telephone Encounter (Signed)
He just got a refill for Xanax recently. This is a [place] address, is he living in [place] now?

## 2015-02-09 MED ORDER — ALPRAZOLAM 1 MG PO TABS: 1.0000 mg | ORAL_TABLET | Freq: Two times a day (BID) | ORAL | Status: AC | PRN

## 2015-02-09 NOTE — Telephone Encounter (Signed)
[place]

## 2015-02-09 NOTE — Telephone Encounter (Signed)
Okay, call in a 90 day supply of Xanax to [place]

## 2015-02-09 NOTE — Telephone Encounter (Signed)
I called in script to [phone] and spoke with pt.

## 2015-02-10 ENCOUNTER — Telehealth: Payer: Self-pay | Admitting: Family Medicine

## 2015-02-10 NOTE — Telephone Encounter (Signed)
Pt is in [place]  And [facility] ALPRAZolam (XANAX) 1 MG tablet  Unless it is done electronically Would like you to do asap because he is out.

## 2015-02-10 NOTE — Telephone Encounter (Signed)
error 

## 2015-03-16 ENCOUNTER — Other Ambulatory Visit: Payer: Self-pay | Admitting: Family Medicine

## 2015-05-11 ENCOUNTER — Telehealth: Payer: Self-pay | Admitting: Family Medicine

## 2015-05-11 NOTE — Telephone Encounter (Addendum)
Pt needs new rxs amlodipine 10 mg.metoprolol  and losartan #90 each w/refills send to [facility] [place] [address]

## 2015-05-12 MED ORDER — LOSARTAN POTASSIUM 50 MG PO TABS: ORAL_TABLET | ORAL | Status: DC

## 2015-05-12 MED ORDER — AMLODIPINE BESYLATE 10 MG PO TABS: 10.0000 mg | ORAL_TABLET | Freq: Every day | ORAL | Status: DC

## 2015-05-12 MED ORDER — METOPROLOL TARTRATE 25 MG PO TABS: 25.0000 mg | ORAL_TABLET | Freq: Every day | ORAL | Status: DC

## 2015-05-12 NOTE — Telephone Encounter (Signed)
I spoke with pt and request to fax to [facility] at [phone]. I did print all 3 scripts and faxed to this number.

## 2015-08-10 ENCOUNTER — Other Ambulatory Visit: Payer: Self-pay | Admitting: Family Medicine

## 2015-11-16 ENCOUNTER — Telehealth: Payer: Self-pay | Admitting: Family Medicine

## 2015-11-16 NOTE — Telephone Encounter (Signed)
Pt last seen dr fry 12/01/14. [place] in [place] and needs all bp meds refill. Pt needs new rxs losartan 50 mg, amlodipine 10 mg and metoprolol 25 mg #30 each w/refills

## 2015-11-18 MED ORDER — LOSARTAN POTASSIUM 50 MG PO TABS: 50.0000 mg | ORAL_TABLET | Freq: Every day | ORAL | 0 refills | Status: DC

## 2015-11-18 MED ORDER — METOPROLOL TARTRATE 25 MG PO TABS: 25.0000 mg | ORAL_TABLET | Freq: Every day | ORAL | 0 refills | Status: DC

## 2015-11-18 MED ORDER — AMLODIPINE BESYLATE 10 MG PO TABS: 10.0000 mg | ORAL_TABLET | Freq: Every day | ORAL | 0 refills | Status: DC

## 2015-11-18 NOTE — Telephone Encounter (Signed)
Pt is aware rxs faxed

## 2015-11-18 NOTE — Telephone Encounter (Signed)
Per Dr. Clent RidgesFry okay to give a 90 day supply on all 3 scripts, these [phone].

## 2016-02-13 ENCOUNTER — Other Ambulatory Visit: Payer: Self-pay | Admitting: Family Medicine

## 2016-03-30 ENCOUNTER — Encounter: Payer: Self-pay | Admitting: Family Medicine

## 2016-03-30 ENCOUNTER — Ambulatory Visit (INDEPENDENT_AMBULATORY_CARE_PROVIDER_SITE_OTHER): Payer: BLUE CROSS/BLUE SHIELD | Admitting: Family Medicine

## 2016-03-30 VITALS — BP 126/88 | HR 75 | Temp 98.2°F | Ht 73.5 in | Wt 234.0 lb

## 2016-03-30 DIAGNOSIS — I10 Essential (primary) hypertension: Secondary | ICD-10-CM | POA: Diagnosis not present

## 2016-03-30 MED ORDER — LOSARTAN POTASSIUM 50 MG PO TABS: 50.0000 mg | ORAL_TABLET | Freq: Every day | ORAL | 3 refills | Status: DC

## 2016-03-30 MED ORDER — AMLODIPINE BESYLATE 10 MG PO TABS: 10.0000 mg | ORAL_TABLET | Freq: Every day | ORAL | 3 refills | Status: DC

## 2016-03-30 MED ORDER — METOPROLOL TARTRATE 25 MG PO TABS: 25.0000 mg | ORAL_TABLET | Freq: Every day | ORAL | 3 refills | Status: DC

## 2016-03-30 NOTE — Progress Notes (Signed)
   Subjective:    Patient ID: [name] [name], male    DOB: 02-09-1966, 50 y.o.   MRN: [id number]  HPI Here for a few days visiting his mother and he wanted to get his meds refilled. [place] in [place] now and he works for a [employer]. He feels great and his BP has been stable.    Review of Systems  Constitutional: Negative.   Respiratory: Negative.   Cardiovascular: Negative.   Neurological: Negative.        Objective:   Physical Exam  Constitutional: He is oriented to person, place, and time. He appears well-developed and well-nourished.  Neck: No thyromegaly present.  Cardiovascular: Normal rate, regular rhythm, normal heart sounds and intact distal pulses.   Pulmonary/Chest: Effort normal and breath sounds normal.  Musculoskeletal: He exhibits no edema.  Lymphadenopathy:    He has no cervical adenopathy.  Neurological: He is alert and oriented to person, place, and time.          Assessment & Plan:  HTN is stable. Refilled meds. [place] in [place] again in [place] months, and I asked im to schedule a cpx with labs during that time.  Gershon CraneStephen Fry, MD

## 2016-03-30 NOTE — Progress Notes (Signed)
Pre visit review using our clinic review tool, if applicable. No additional management support is needed unless otherwise documented below in the visit note. 

## 2016-04-27 ENCOUNTER — Telehealth: Payer: Self-pay | Admitting: Family Medicine

## 2016-04-27 NOTE — Telephone Encounter (Addendum)
Pt was seen on 03/30/16 and [facility] [place] and [facility] no longer participate with [facility].. Pt would like to have his rxs send to [facility] [address] in [place] phone #[phone]. Pt needs 90 day supply of metoprolol , losartan and amlodipine

## 2016-04-30 ENCOUNTER — Telehealth: Payer: Self-pay | Admitting: Family Medicine

## 2016-04-30 NOTE — Telephone Encounter (Signed)
Pt need new Rx for amlodipine   Pharm:  [facility] in [place]

## 2016-05-01 NOTE — Telephone Encounter (Signed)
This is a duplicate, see previous note.  

## 2016-05-01 NOTE — Telephone Encounter (Signed)
I left a voice message for pt, all 3 scripts were printed and gave to pt on 03/30/2016 for a 1 year supply. Pt can have these scripts transferred to another pharmacy, we should not have to redo.

## 2016-08-02 ENCOUNTER — Other Ambulatory Visit: Payer: Self-pay | Admitting: Family Medicine

## 2016-11-06 ENCOUNTER — Other Ambulatory Visit: Payer: Self-pay | Admitting: Family Medicine

## 2017-02-23 ENCOUNTER — Other Ambulatory Visit: Payer: Self-pay | Admitting: Family Medicine

## 2017-02-25 NOTE — Telephone Encounter (Signed)
Sent to PCP for approval.  

## 2017-06-28 ENCOUNTER — Other Ambulatory Visit: Payer: Self-pay | Admitting: Family Medicine

## 2017-06-28 NOTE — Telephone Encounter (Signed)
Copied from CRM [id number]. Topic: Quick Communication - See Telephone Encounter >> Jun 28, 2017 10:26 AM Trula SladeWalter, Linda F wrote: CRM for notification. See Telephone encounter for: 06/28/17. Lanora Manislizabeth w/[facility] [phone] needs the following prescriptions refilled for the patient: 1) amLODipine (NORVASC) 10 MG tablet   2) losartan (COZAAR) 50 MG tablet

## 2017-06-28 NOTE — Telephone Encounter (Signed)
Last OV 03/30/2016   Pt stated that he is in [place] and wouldn't be able to schedule for an appt until sometime in April. Tried to schedule pt for appt but he stated that he would call back to do that. Would like a short term Rx sent into pharmacy. Sent to PCP for approval.

## 2017-06-28 NOTE — Telephone Encounter (Signed)
Call in #90 for both with no rf

## 2017-06-28 NOTE — Telephone Encounter (Signed)
Call in #90 with no rf 

## 2017-06-28 NOTE — Telephone Encounter (Signed)
Sent to PCP for approval.  

## 2017-07-01 MED ORDER — AMLODIPINE BESYLATE 10 MG PO TABS: 10.0000 mg | ORAL_TABLET | Freq: Every day | ORAL | 0 refills | Status: AC

## 2017-07-01 MED ORDER — LOSARTAN POTASSIUM 50 MG PO TABS: 50.0000 mg | ORAL_TABLET | Freq: Every day | ORAL | 0 refills | Status: AC

## 2017-07-01 NOTE — Telephone Encounter (Signed)
Rx was sent in today 90 day supply with no refills

## 2017-07-02 ENCOUNTER — Other Ambulatory Visit: Payer: Self-pay | Admitting: Family Medicine

## 2017-09-28 ENCOUNTER — Other Ambulatory Visit: Payer: Self-pay | Admitting: Family Medicine

## 2017-09-30 NOTE — Telephone Encounter (Signed)
Last OV 12/22/207
